# Patient Record
Sex: Female | Born: 1980 | Race: White | Hispanic: No | Marital: Married | Smoking: Former smoker
Health system: Southern US, Community
[De-identification: ages and names within clinical notes are randomized; demographics above are authoritative.]

## PROBLEM LIST (undated history)

## (undated) ENCOUNTER — Emergency Department (HOSPITAL_COMMUNITY): Payer: 59

## (undated) DIAGNOSIS — K5190 Ulcerative colitis, unspecified, without complications: Secondary | ICD-10-CM

## (undated) DIAGNOSIS — K8050 Calculus of bile duct without cholangitis or cholecystitis without obstruction: Secondary | ICD-10-CM

## (undated) HISTORY — DX: Ulcerative colitis, unspecified, without complications: K51.90

## (undated) HISTORY — PX: DILATION AND CURETTAGE OF UTERUS: SHX78

---

## 2008-08-15 HISTORY — PX: COLONOSCOPY: SHX174

## 2013-08-15 HISTORY — PX: ESOPHAGOGASTRODUODENOSCOPY: SHX1529

## 2021-01-15 ENCOUNTER — Other Ambulatory Visit (HOSPITAL_COMMUNITY)
Admission: RE | Admit: 2021-01-15 | Discharge: 2021-01-15 | Disposition: A | Discharge status: Home / Self Care | Payer: 59 | Source: Ambulatory Visit | Attending: Obstetrics & Gynecology | Admitting: Obstetrics & Gynecology

## 2021-01-15 ENCOUNTER — Encounter: Payer: Self-pay | Admitting: Obstetrics & Gynecology

## 2021-01-15 ENCOUNTER — Ambulatory Visit (INDEPENDENT_AMBULATORY_CARE_PROVIDER_SITE_OTHER): Payer: 59 | Admitting: Obstetrics & Gynecology

## 2021-01-15 ENCOUNTER — Other Ambulatory Visit: Payer: Self-pay

## 2021-01-15 VITALS — BP 137/77 | HR 76 | Ht 67.0 in | Wt 183.0 lb

## 2021-01-15 DIAGNOSIS — Z01419 Encounter for gynecological examination (general) (routine) without abnormal findings: Secondary | ICD-10-CM | POA: Diagnosis not present

## 2021-01-15 DIAGNOSIS — Z1231 Encounter for screening mammogram for malignant neoplasm of breast: Secondary | ICD-10-CM

## 2021-01-15 DIAGNOSIS — Z124 Encounter for screening for malignant neoplasm of cervix: Secondary | ICD-10-CM | POA: Diagnosis not present

## 2021-01-15 NOTE — Patient Instructions (Signed)
Please schedule a mammogram at one of the following locations:  [place]: [phone]  [facility] in [place]:[phone] [address]  

## 2021-01-15 NOTE — Progress Notes (Signed)
   WELL-WOMAN EXAMINATION Patient name: Alicia Moon MRN [id number]  Date of birth: 02-12-1981 Chief Complaint:   Annual Exam  History of Present Illness:   Alicia Moon is a 40 y.o. G24P0010 female being seen today for a routine well-woman exam.  Today she notes: no acute complaints or concerns   Patient's last menstrual period was 12/26/2020. Denies issues with her menses The current method of family planning is OCP (estrogen/progesterone).   Originally from [place], lived in [place] for many years, recently moved back ~ 101mos.  Really loves it here and the slower pace.  No longer working/retired  Last pap outside facility.  Last mammogram: last year (outside facility). Last colonoscopy: n/a  Depression screen [facility] 2/9 01/15/2021  Decreased Interest 0  Down, Depressed, Hopeless 0  PHQ - 2 Score 0  Altered sleeping 0  Tired, decreased energy 0  Change in appetite 0  Feeling bad or failure about yourself  0  Trouble concentrating 0  Moving slowly or fidgety/restless 0  Suicidal thoughts 0  PHQ-9 Score 0     Review of Systems:   Pertinent items are noted in HPI Denies any headaches, blurred vision, fatigue, shortness of breath, chest pain, abdominal pain, bowel movements, urination, or intercourse unless otherwise stated above.  Pertinent History Reviewed:  Reviewed past medical,surgical, social and family history.  Reviewed problem list, medications and allergies. Physical Assessment:   Vitals:   01/15/21 1102  BP: 137/77  Pulse: 76  Weight: 183 lb (83 kg)  Height: 5\' 7"  (1.702 m)  Body mass index is 28.66 kg/m.        Physical Examination:   General appearance - well appearing, and in no distress  Mental status - alert, oriented to person, place, and time  Psych:  She has a normal mood and affect  Skin - warm and dry, normal color, no suspicious lesions noted  Chest - effort normal, all lung fields clear to auscultation bilaterally  Heart - normal rate  and regular rhythm  Neck:  midline trachea, no thyromegaly or nodules  Breasts - breasts appear normal, no suspicious masses, no skin or nipple changes or  axillary nodes  Abdomen - soft, nontender, nondistended, no masses or organomegaly  Pelvic - VULVA: normal appearing vulva with no masses, tenderness or lesions  VAGINA: normal appearing vagina with normal color and discharge, no lesions  CERVIX: normal appearing cervix without discharge or lesions, no CMT  Thin prep pap is done with HR HPV cotesting  UTERUS: uterus is felt to be normal size, shape, consistency and nontender   ADNEXA: No adnexal masses or tenderness noted.  Extremities:  No swelling or varicosities noted  Chaperone: & Plan:  1) Well-Woman Exam -pap collected -mammogram ordered  2) Contraceptive management -on OCP and wishes to continue  Orders Placed This Encounter  Procedures  . MM 3D SCREEN BREAST BILATERAL    Meds: No orders of the defined types were placed in this encounter.   Follow-up: Return in about 1 year (around 01/15/2022) for annual (please print AVS).   03/17/2022, DO Attending Obstetrician & Gynecologist, [facility] for [facility]

## 2021-01-18 ENCOUNTER — Other Ambulatory Visit: Payer: Self-pay | Admitting: Adult Health

## 2021-01-19 LAB — CYTOLOGY - PAP
Adequacy: ABSENT
Comment: NEGATIVE
Diagnosis: NEGATIVE
High risk HPV: NEGATIVE

## 2021-01-28 ENCOUNTER — Other Ambulatory Visit: Payer: Self-pay

## 2021-01-28 ENCOUNTER — Encounter: Payer: Self-pay | Admitting: Adult Health

## 2021-01-28 ENCOUNTER — Ambulatory Visit: Payer: 59 | Admitting: Adult Health

## 2021-01-28 VITALS — BP 132/86 | HR 84 | Ht 66.5 in | Wt 183.0 lb

## 2021-01-28 DIAGNOSIS — N9089 Other specified noninflammatory disorders of vulva and perineum: Secondary | ICD-10-CM | POA: Diagnosis not present

## 2021-01-28 DIAGNOSIS — Z3041 Encounter for surveillance of contraceptive pills: Secondary | ICD-10-CM | POA: Diagnosis not present

## 2021-01-28 MED ORDER — NYSTATIN-TRIAMCINOLONE 100000-0.1 UNIT/GM-% EX CREA: 1.0000 "application " | TOPICAL_CREAM | Freq: Two times a day (BID) | CUTANEOUS | 0 refills | Status: DC

## 2021-01-28 MED ORDER — SRONYX 0.1-20 MG-MCG PO TABS: 1.0000 | ORAL_TABLET | Freq: Every day | ORAL | 4 refills | Status: DC

## 2021-01-28 NOTE — Progress Notes (Addendum)
  Subjective:     Patient ID: Alicia Moon, female   DOB: 07-01-1981, 40 y.o.   MRN: [id number]  HPI Alicia Moon is a 40 year old white female, married, G1P0010, in complaining of bump left labia is much better after using warm compresses, feels itchy at times. She requests refill on OCs.  Lab Results  Component Value Date   DIAGPAP  01/15/2021    - Negative for intraepithelial lesion or malignancy (NILM)   HPVHIGH Negative 01/15/2021     Review of Systems Has had bump left labia that was painful is much better but feels itchy Reviewed past medical,surgical, social and family history. Reviewed medications and allergies.     Objective:   Physical Exam BP 132/86 (BP Location: Left Arm, Patient Position: Sitting, Cuff Size: Normal)   Pulse 84   Ht 5' 6.5" (1.689 m)   Wt 183 lb (83 kg)   LMP 01/24/2021   BMI 29.09 kg/m     Skin warm and dry.Pelvic: external genitalia is normal in appearance no lesions, bumps or redness. Fall risk is low  Upstream - 01/28/21 1627       Pregnancy Intention Screening   Does the patient want to become pregnant in the next year? No    Does the patient's partner want to become pregnant in the next year? No    Would the patient like to discuss contraceptive options today? No      Contraception Wrap Up   Current Method Oral Contraceptive    End Method Oral Contraceptive    Contraception Counseling Provided No            Examination chaperoned by Malachy Mood LPN  Assessment:     1. Encounter for surveillance of contraceptive pills Will refill OCs  Meds ordered this encounter  Medications   nystatin-triamcinolone (MYCOLOG II) cream    Sig: Apply 1 application topically 2 (two) times daily.    Dispense:  30 g    Refill:  0    Order Specific Question:   Supervising Provider    Answer:   EURE, LUTHER H [[id number]]   SRONYX 0.1-20 MG-MCG tablet    Sig: Take 1 tablet by mouth daily.    Dispense:  84 tablet    Refill:  4    Order Specific  Question:   Supervising Provider    Answer:   Despina Hidden, LUTHER H [[id number]]     2. Vulvar irritation Will rx mytrex to use prn     Plan:     Follow up prn

## 2021-02-10 ENCOUNTER — Other Ambulatory Visit: Payer: Self-pay

## 2021-02-10 ENCOUNTER — Encounter (HOSPITAL_COMMUNITY): Payer: Self-pay

## 2021-02-10 ENCOUNTER — Ambulatory Visit (HOSPITAL_COMMUNITY)
Admission: RE | Admit: 2021-02-10 | Discharge: 2021-02-10 | Disposition: A | Discharge status: Home / Self Care | Payer: 59 | Source: Ambulatory Visit | Attending: Obstetrics & Gynecology | Admitting: Obstetrics & Gynecology

## 2021-02-10 DIAGNOSIS — Z1231 Encounter for screening mammogram for malignant neoplasm of breast: Secondary | ICD-10-CM | POA: Insufficient documentation

## 2021-02-25 ENCOUNTER — Other Ambulatory Visit (HOSPITAL_COMMUNITY): Payer: Self-pay | Admitting: Obstetrics & Gynecology

## 2021-02-25 ENCOUNTER — Inpatient Hospital Stay
Admission: RE | Admit: 2021-02-25 | Discharge: 2021-02-25 | Disposition: A | Discharge status: Home / Self Care | Payer: Self-pay | Source: Ambulatory Visit | Attending: Obstetrics & Gynecology | Admitting: Obstetrics & Gynecology

## 2021-02-25 DIAGNOSIS — Z1231 Encounter for screening mammogram for malignant neoplasm of breast: Secondary | ICD-10-CM

## 2021-03-09 ENCOUNTER — Other Ambulatory Visit (HOSPITAL_COMMUNITY): Payer: Self-pay | Admitting: Family Medicine

## 2021-03-09 DIAGNOSIS — N63 Unspecified lump in unspecified breast: Secondary | ICD-10-CM

## 2021-03-15 ENCOUNTER — Other Ambulatory Visit: Payer: Self-pay

## 2021-03-15 ENCOUNTER — Ambulatory Visit
Admission: RE | Admit: 2021-03-15 | Discharge: 2021-03-15 | Disposition: A | Discharge status: Home / Self Care | Payer: 59 | Source: Ambulatory Visit | Attending: Family Medicine | Admitting: Family Medicine

## 2021-03-15 VITALS — BP 132/82 | HR 75 | Temp 97.9°F | Resp 15

## 2021-03-15 DIAGNOSIS — J069 Acute upper respiratory infection, unspecified: Secondary | ICD-10-CM | POA: Diagnosis not present

## 2021-03-15 MED ORDER — PROMETHAZINE-DM 6.25-15 MG/5ML PO SYRP: 5.0000 mL | ORAL_SOLUTION | Freq: Four times a day (QID) | ORAL | 0 refills | Status: DC | PRN

## 2021-03-15 NOTE — ED Provider Notes (Signed)
[facility]    CSN: [id number] Arrival date & time: 03/15/21  0847      History   Chief Complaint No chief complaint on file.   HPI Alicia Moon is a 40 y.o. female.   HPI Patient presents with URI symptoms including sore throat, otalgia, post nasal drainage, shoulder aches. No fever, severe cough, chest pain, nausea, or vomiting.Unknown of COVID exposure.  She has taken Sudafed, and Claritin, and ibuprofen.   Past Medical History:  Diagnosis Date   Ulcerative colitis [facility])     Patient Active Problem List   Diagnosis Date Noted   Encounter for surveillance of contraceptive pills 01/28/2021   Vulvar irritation 01/28/2021    Past Surgical History:  Procedure Laterality Date   DILATION AND CURETTAGE OF UTERUS      OB History     Gravida  1   Para      Term      Preterm      AB  1   Living  0      SAB      IAB      Ectopic      Multiple      Live Births               Home Medications    Prior to Admission medications   Medication Sig Start Date End Date Taking? Authorizing Provider  promethazine-dextromethorphan (PROMETHAZINE-DM) 6.25-15 MG/5ML syrup Take 5 mLs by mouth 4 (four) times daily as needed for cough. 03/15/21  Yes Bing Neighbors, FNP  Multiple Vitamin (MULTIVITAMIN ADULT) TABS     [provider]  nystatin-triamcinolone (MYCOLOG II) cream Apply 1 application topically 2 (two) times daily. 01/28/21   Adline Potter, NP  SRONYX 0.1-20 MG-MCG tablet Take 1 tablet by mouth daily. 01/28/21   Adline Potter, NP    Family History Family History  Problem Relation Age of Onset   Colon cancer Maternal Grandmother    Colon cancer Other    Breast cancer Paternal Great-grandmother     Social History Social History   Tobacco Use   Smoking status: Former    Types: Cigarettes   Smokeless tobacco: Never  Vaping Use   Vaping Use: Never used  Substance Use Topics   Alcohol use: Yes    Comment: occ    Drug use: Never     Allergies   Sulfa antibiotics   Review of Systems Review of Systems Pertinent negatives listed in HPI'  Physical Exam Triage Vital Signs ED Triage Vitals  Enc Vitals Group     BP 03/15/21 0950 132/82     Pulse Rate 03/15/21 0950 75     Resp 03/15/21 0950 15     Temp 03/15/21 0950 97.9 F (36.6 C)     Temp Source 03/15/21 0950 Tympanic     SpO2 03/15/21 0950 99 %     Weight --      Height --      Head Circumference --      Peak Flow --      Pain Score 03/15/21 0956 0     Pain Loc --      Pain Edu? --      Excl. in GC? --    No data found.  Updated Vital Signs BP 132/82 (BP Location: Right Arm)   Pulse 75   Temp 97.9 F (36.6 C) (Tympanic)   Resp 15   SpO2 99%   Visual Acuity Right  Eye Distance:   Left Eye Distance:   Bilateral Distance:    Right Eye Near:   Left Eye Near:    Bilateral Near:     Physical Exam  General Appearance:    Alert, cooperative, no distress  HENT:   Normocephalic, ears MEE right ear /left normal, nares mucosal edema with congestion, rhinorrhea, oropharynx erythematous without exudate  Eyes:    PERRL, conjunctiva/corneas clear, EOM's intact       Lungs:     Clear to auscultation bilaterally, respirations unlabored  Heart:    Regular rate and rhythm  Neurologic:   Awake, alert, oriented x 3. No apparent focal neurological deficit.     UC Treatments / Results  Labs (all labs ordered are listed, but only abnormal results are displayed) Labs Reviewed  COVID-19, FLU A+B NAA    EKG   Radiology No results found.  Procedures Procedures (including critical care time)  Medications Ordered in UC Medications - No data to display  Initial Impression / Assessment and Plan / UC Course  I have reviewed the triage vital signs and the nursing notes.  Pertinent labs & imaging results that were available during my care of the patient were reviewed by me and considered in my medical decision making (see chart  for details).     COVID pending. Symptom management warranted only.  Manage fever with Tylenol and ibuprofen.  Nasal symptoms with over-the-counter antihistamines recommended.  Treatment per discharge medications/discharge instructions.  Red flags/ER precautions given. The most current CDC isolation/quarantine recommendation advised.   Final Clinical Impressions(s) / UC Diagnoses   Final diagnoses:  Viral URI     Discharge Instructions      Sudafed every 12 hour as needed. Promethazine every 4-6 hours as needed for cough nasal. Your COVID 19 results should result within 2-4 days. Negative results are immediately resulted to Mychart. Positive results will receive a follow-up call from our clinic. If symptoms are present, I recommend home quarantine until results are known.  Alternate Tylenol and ibuprofen as needed for body aches and fever.  Symptom management per recommendations discussed today.  If any breathing difficulty or chest pain develops go immediately to the closest emergency department for evaluation.       ED Prescriptions     Medication Sig Dispense Auth. Provider   promethazine-dextromethorphan (PROMETHAZINE-DM) 6.25-15 MG/5ML syrup Take 5 mLs by mouth 4 (four) times daily as needed for cough. 140 mL Bing Neighbors, FNP      PDMP not reviewed this encounter.   Bing Neighbors, FNP 03/15/21 1037

## 2021-03-15 NOTE — Discharge Instructions (Addendum)
Sudafed every 12 hour as needed. Promethazine every 4-6 hours as needed for cough nasal. Your COVID 19 results should result within 2-4 days. Negative results are immediately resulted to Mychart. Positive results will receive a follow-up call from our clinic. If symptoms are present, I recommend home quarantine until results are known.  Alternate Tylenol and ibuprofen as needed for body aches and fever.  Symptom management per recommendations discussed today.  If any breathing difficulty or chest pain develops go immediately to the closest emergency department for evaluation.

## 2021-03-15 NOTE — ED Triage Notes (Addendum)
Post nasal drip, scratchy throat,  cough. At home covid is neg.

## 2021-03-16 LAB — COVID-19, FLU A+B NAA
Influenza A, NAA: NOT DETECTED
Influenza B, NAA: NOT DETECTED
SARS-CoV-2, NAA: DETECTED — AB

## 2021-03-30 ENCOUNTER — Ambulatory Visit (HOSPITAL_COMMUNITY)
Admission: RE | Admit: 2021-03-30 | Discharge: 2021-03-30 | Disposition: A | Discharge status: Home / Self Care | Payer: 59 | Source: Ambulatory Visit | Attending: Family Medicine | Admitting: Family Medicine

## 2021-03-30 ENCOUNTER — Other Ambulatory Visit: Payer: Self-pay

## 2021-03-30 DIAGNOSIS — N63 Unspecified lump in unspecified breast: Secondary | ICD-10-CM | POA: Insufficient documentation

## 2021-08-12 DIAGNOSIS — E785 Hyperlipidemia, unspecified: Secondary | ICD-10-CM | POA: Diagnosis not present

## 2021-10-05 DIAGNOSIS — Z1283 Encounter for screening for malignant neoplasm of skin: Secondary | ICD-10-CM | POA: Diagnosis not present

## 2021-10-05 DIAGNOSIS — D225 Melanocytic nevi of trunk: Secondary | ICD-10-CM | POA: Diagnosis not present

## 2022-01-03 ENCOUNTER — Other Ambulatory Visit (HOSPITAL_COMMUNITY): Payer: Self-pay | Admitting: Obstetrics & Gynecology

## 2022-01-03 DIAGNOSIS — Z1231 Encounter for screening mammogram for malignant neoplasm of breast: Secondary | ICD-10-CM

## 2022-01-20 ENCOUNTER — Encounter: Payer: Self-pay | Admitting: Obstetrics & Gynecology

## 2022-01-20 ENCOUNTER — Ambulatory Visit (INDEPENDENT_AMBULATORY_CARE_PROVIDER_SITE_OTHER): Payer: BC Managed Care – PPO | Admitting: Obstetrics & Gynecology

## 2022-01-20 VITALS — BP 129/82 | HR 71 | Ht 67.0 in | Wt 184.0 lb

## 2022-01-20 DIAGNOSIS — Z01419 Encounter for gynecological examination (general) (routine) without abnormal findings: Secondary | ICD-10-CM | POA: Diagnosis not present

## 2022-01-20 DIAGNOSIS — Z3041 Encounter for surveillance of contraceptive pills: Secondary | ICD-10-CM

## 2022-01-20 MED ORDER — SRONYX 0.1-20 MG-MCG PO TABS: 1.0000 | ORAL_TABLET | Freq: Every day | ORAL | 4 refills | Status: DC

## 2022-01-20 NOTE — Progress Notes (Signed)
   WELL-WOMAN EXAMINATION Patient name: Alicia Moon MRN [id number]  Date of birth: 01-01-1981 Chief Complaint:   Gynecologic Exam  History of Present Illness:   Alicia Moon is a 41 y.o. G40P0010 female being seen today for a routine well-woman exam.  Today she notes: no acute complaints or concerns  Doing well with OCPs.  Menses moderate to light, denies intermenstrual bleeding.  Denies issues with her menses  Patient's last menstrual period was 12/27/2021 (exact date).  The current method of family planning is OCP (estrogen/progesterone).    Last pap 01/2021 neg.  Last mammogram: 01/2021- scheduled for August 2023. Last colonoscopy: n/a     01/20/2022    8:53 AM 01/15/2021   10:50 AM  Depression screen PHQ 2/9  Decreased Interest 0 0  Down, Depressed, Hopeless 0 0  PHQ - 2 Score 0 0  Altered sleeping 0 0  Tired, decreased energy 0 0  Change in appetite 0 0  Feeling bad or failure about yourself  0 0  Trouble concentrating 0 0  Moving slowly or fidgety/restless 0 0  Suicidal thoughts 0 0  PHQ-9 Score 0 0      Review of Systems:   Pertinent items are noted in HPI Denies any headaches, blurred vision, fatigue, shortness of breath, chest pain, abdominal pain, bowel movements, urination, or intercourse unless otherwise stated above.  Pertinent History Reviewed:  Reviewed past medical,surgical, social and family history.  Reviewed problem list, medications and allergies. Physical Assessment:   Vitals:   01/20/22 0848  BP: 129/82  Pulse: 71  Weight: 184 lb (83.5 kg)  Height: 5\' 7"  (1.702 m)  Body mass index is 28.82 kg/m.        Physical Examination:   General appearance - well appearing, and in no distress  Mental status - alert, oriented to person, place, and time  Psych:  She has a normal mood and affect  Skin - warm and dry, normal color, no suspicious lesions noted  Chest - effort normal, all lung fields clear to auscultation bilaterally  Heart -  normal rate and regular rhythm  Neck:  midline trachea, no thyromegaly or nodules  Breasts - left upper inner quadrant ~ 11 o'clock 1cm non-tender mass- prior imaging confirmed lipoma breasts appear normal, no suspicious masses, no skin or nipple changes or  axillary nodes  Abdomen - soft, nontender, nondistended, no masses or organomegaly  Pelvic - VULVA: normal appearing vulva with no masses, tenderness or lesions  VAGINA: normal appearing vagina with normal color and discharge, no lesions  CERVIX: normal appearing cervix without discharge or lesions, no CMT  UTERUS: uterus is felt to be normal size, shape, consistency and nontender   ADNEXA: No adnexal masses or tenderness noted.  Extremities:  No swelling or varicosities noted  Chaperone:     Assessment & Plan:  1) Well-Woman Exam Pap up to date reviewed screening Mammogram scheduled  2) Contraception -doing well with OCPs, plan to continue  Meds:  Meds ordered this encounter  Medications   SRONYX 0.1-20 MG-MCG tablet    Sig: Take 1 tablet by mouth daily.    Dispense:  90 tablet    Refill:  4    Follow-up: Return in about 1 year (around 01/21/2023) for Annual.   03/23/2023, DO Attending Obstetrician & Gynecologist, [facility], [facility]

## 2022-01-26 DIAGNOSIS — Z0000 Encounter for general adult medical examination without abnormal findings: Secondary | ICD-10-CM | POA: Diagnosis not present

## 2022-01-26 DIAGNOSIS — E785 Hyperlipidemia, unspecified: Secondary | ICD-10-CM | POA: Diagnosis not present

## 2022-02-02 ENCOUNTER — Ambulatory Visit (HOSPITAL_COMMUNITY)
Admission: RE | Admit: 2022-02-02 | Discharge: 2022-02-02 | Disposition: A | Discharge status: Home / Self Care | Payer: BC Managed Care – PPO | Source: Ambulatory Visit | Attending: Nurse Practitioner | Admitting: Nurse Practitioner

## 2022-02-02 ENCOUNTER — Other Ambulatory Visit (HOSPITAL_COMMUNITY): Payer: Self-pay | Admitting: Nurse Practitioner

## 2022-02-02 DIAGNOSIS — R079 Chest pain, unspecified: Secondary | ICD-10-CM | POA: Insufficient documentation

## 2022-02-02 DIAGNOSIS — Z0001 Encounter for general adult medical examination with abnormal findings: Secondary | ICD-10-CM | POA: Diagnosis not present

## 2022-02-09 ENCOUNTER — Other Ambulatory Visit: Payer: Self-pay | Admitting: Nurse Practitioner

## 2022-02-09 ENCOUNTER — Other Ambulatory Visit (HOSPITAL_COMMUNITY): Payer: Self-pay | Admitting: Nurse Practitioner

## 2022-02-09 DIAGNOSIS — R079 Chest pain, unspecified: Secondary | ICD-10-CM

## 2022-02-19 IMAGING — US US BREAST*L* LIMITED INC AXILLA
1 series · 10 of 10 positions shown · non-contrast
Comparison: Previous exams.

CLINICAL DATA: 40-year-old female with a palpable area of concern
in the left breast.

EXAM:
DIGITAL DIAGNOSTIC UNILATERAL LEFT MAMMOGRAM WITH TOMOSYNTHESIS AND
CAD; ULTRASOUND LEFT BREAST LIMITED
TECHNIQUE: Left digital diagnostic mammography and breast tomosynthesis was
performed. The images were evaluated with computer-aided detection.;
Targeted ultrasound examination of the left breast was performed.

[Series 1: us breast*left* limited inc axilla · 0.07mm/px · 10 of 10 slices shown]
[im 1/10]
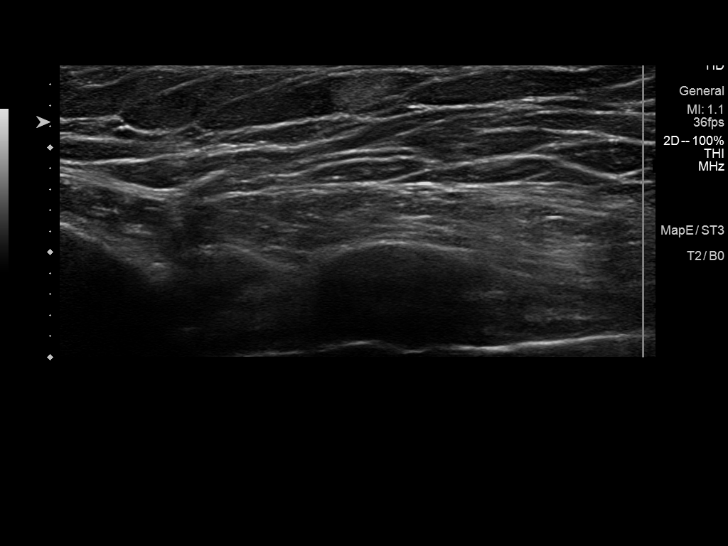
[im 2/10]
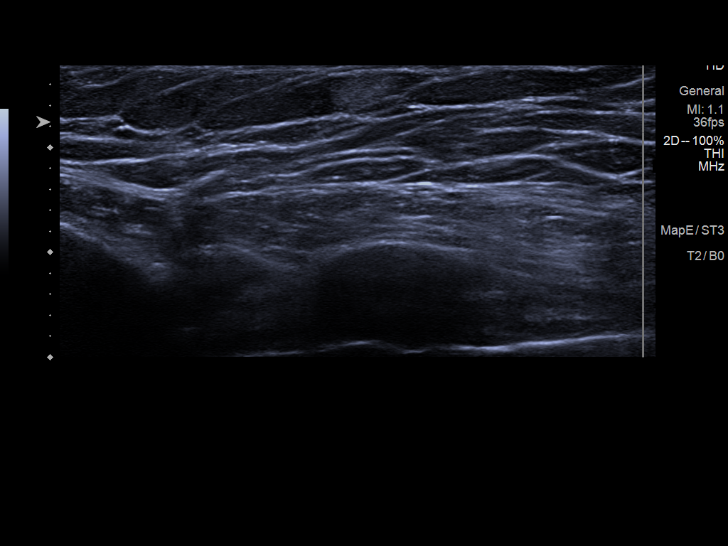
[im 3/10]
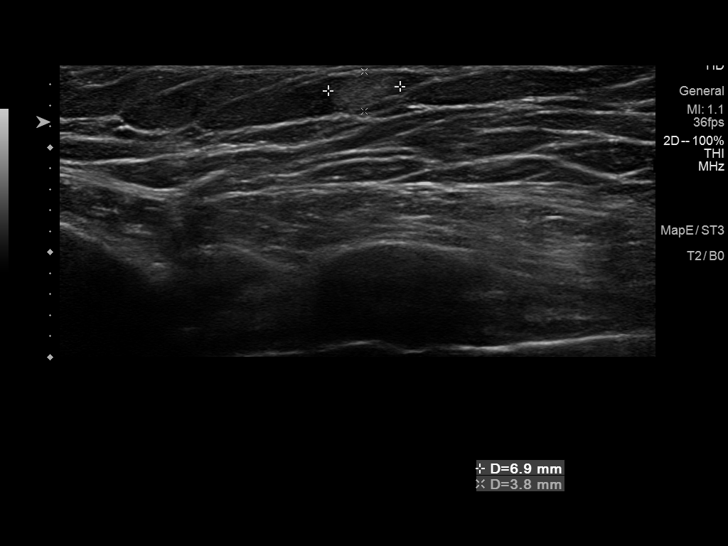
[im 4/10]
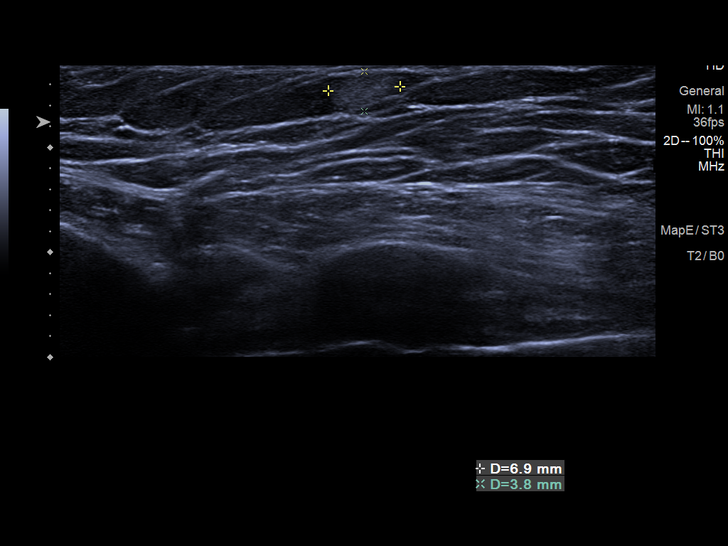
[im 5/10]
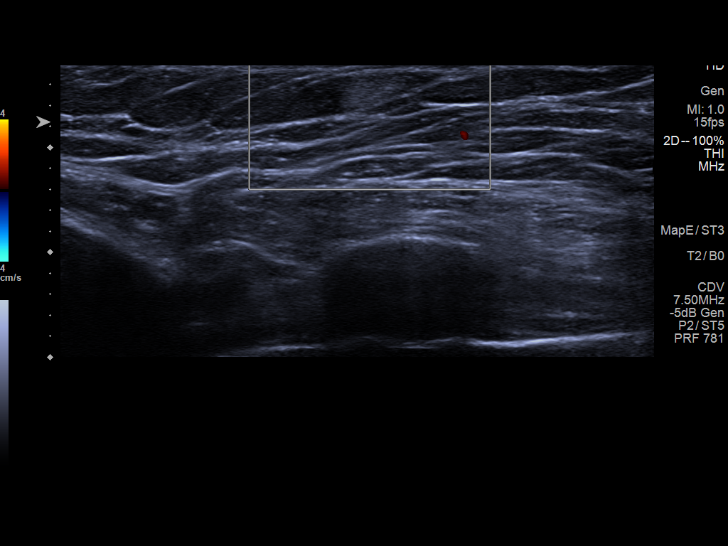
[im 6/10]
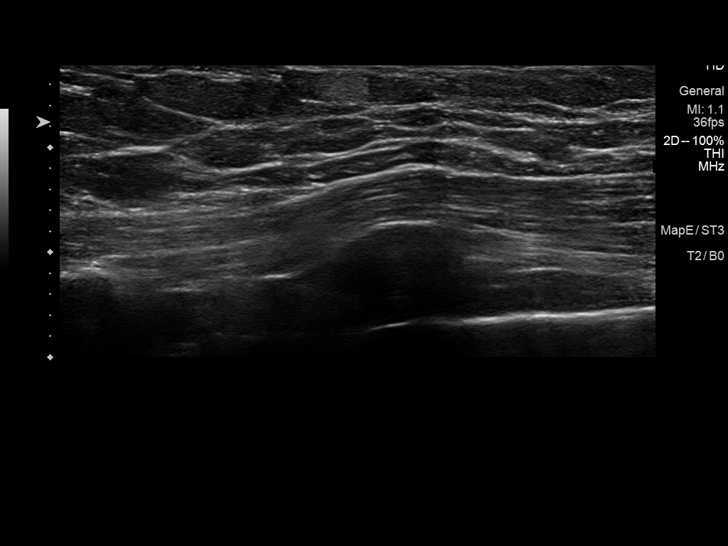
[im 7/10]
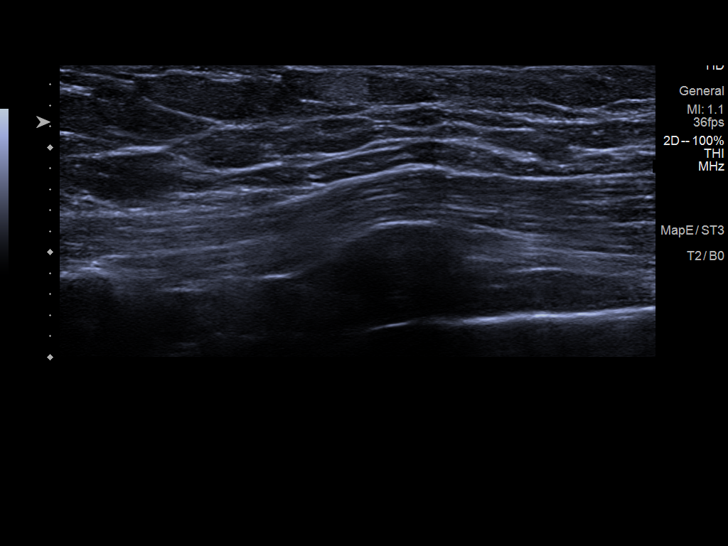
[im 8/10]
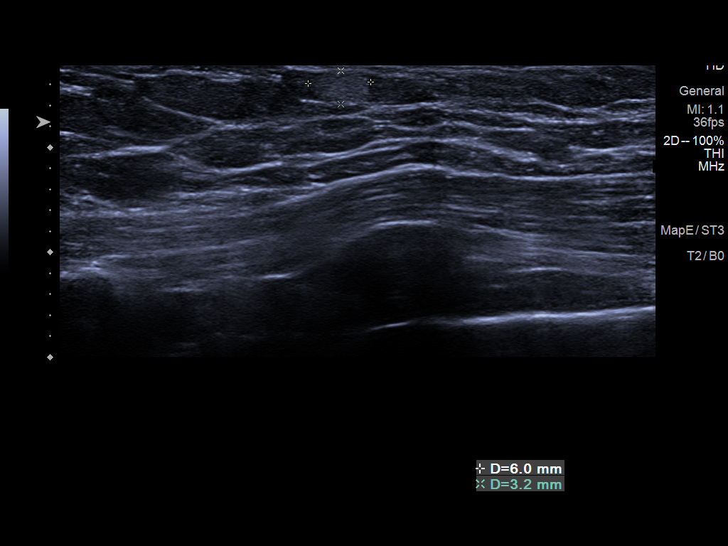
[im 9/10]
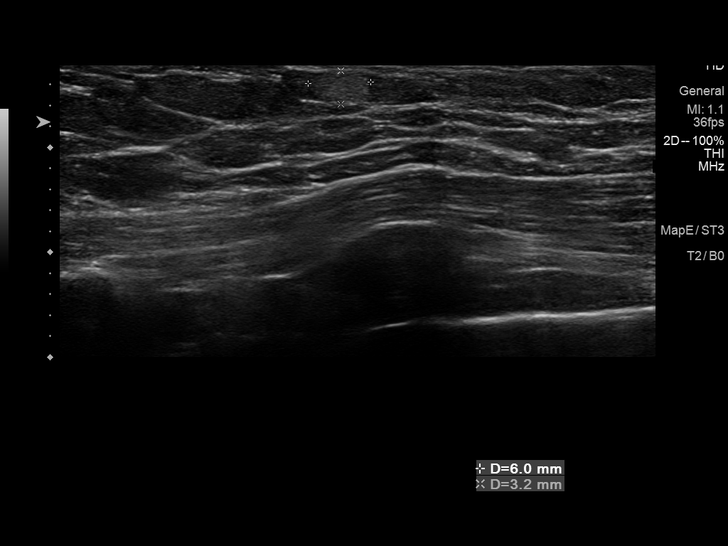
[im 10/10]
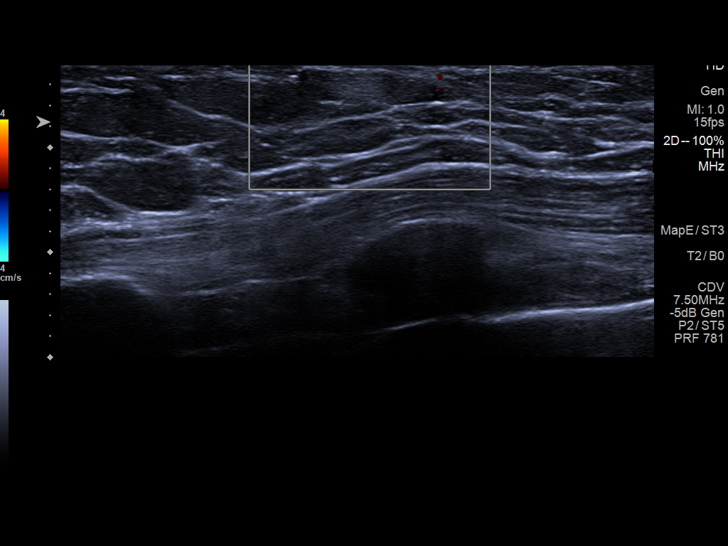

[10 of 10 positions shown; findings below may reference images not displayed]

ACR Breast Density Category b: There are scattered areas of
fibroglandular density.
FINDINGS: No suspicious masses or calcifications are seen in the left breast.
Spot compression tangential tomograms were performed over the
palpable area of concern in the left breast with no definite
abnormality seen.

Physical examination at site of palpable concern in the upper inner
left breast reveals a soft slightly mobile dime-sized mass.

Targeted ultrasound of the left breast was performed. There is an
oval circumscribed mildly hyperechoic mass in the left breast at 11
o'clock 12 cm from nipple measuring 0.7 x 0.4 x 0.6 cm. Findings are
most consistent with a benign lipoma. No suspicious masses or
abnormality seen in the region of palpable concern in the left
breast.
IMPRESSION: Small lipoma at site of palpable concern in the left breast. No
mammographic evidence of malignancy in the left breast.

RECOMMENDATION:
Recommend annual routine screening mammography, due January 2022.

I have discussed the findings and recommendations with the patient.
If applicable, a reminder letter will be sent to the patient
regarding the next appointment.

BI-RADS CATEGORY  2: Benign.

## 2022-02-19 IMAGING — MG MM DIGITAL DIAGNOSTIC UNILAT*L* W/ TOMO W/ CAD
6 series · 6 of 18 positions shown · non-contrast
Comparison: Previous exams.

CLINICAL DATA: 40-year-old female with a palpable area of concern
in the left breast.

EXAM:
DIGITAL DIAGNOSTIC UNILATERAL LEFT MAMMOGRAM WITH TOMOSYNTHESIS AND
CAD; ULTRASOUND LEFT BREAST LIMITED
TECHNIQUE: Left digital diagnostic mammography and breast tomosynthesis was
performed. The images were evaluated with computer-aided detection.;
Targeted ultrasound examination of the left breast was performed.

[L MLO synth-2D]
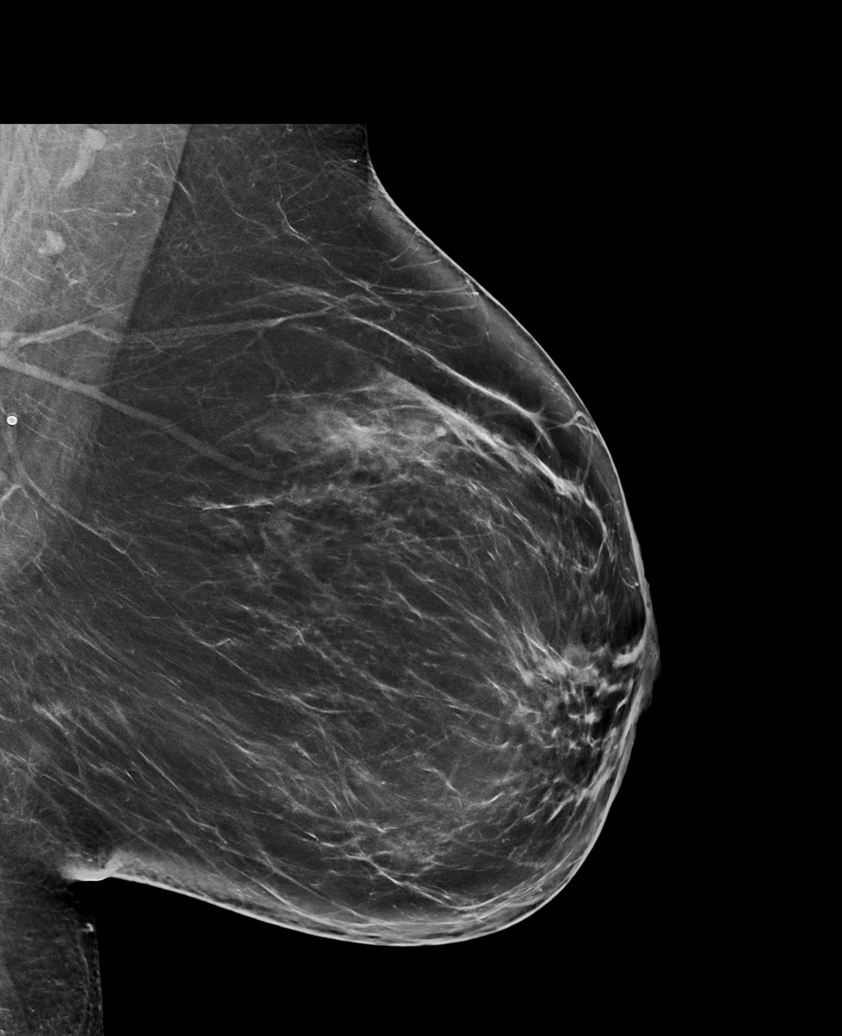

[L TAN synth-2D]
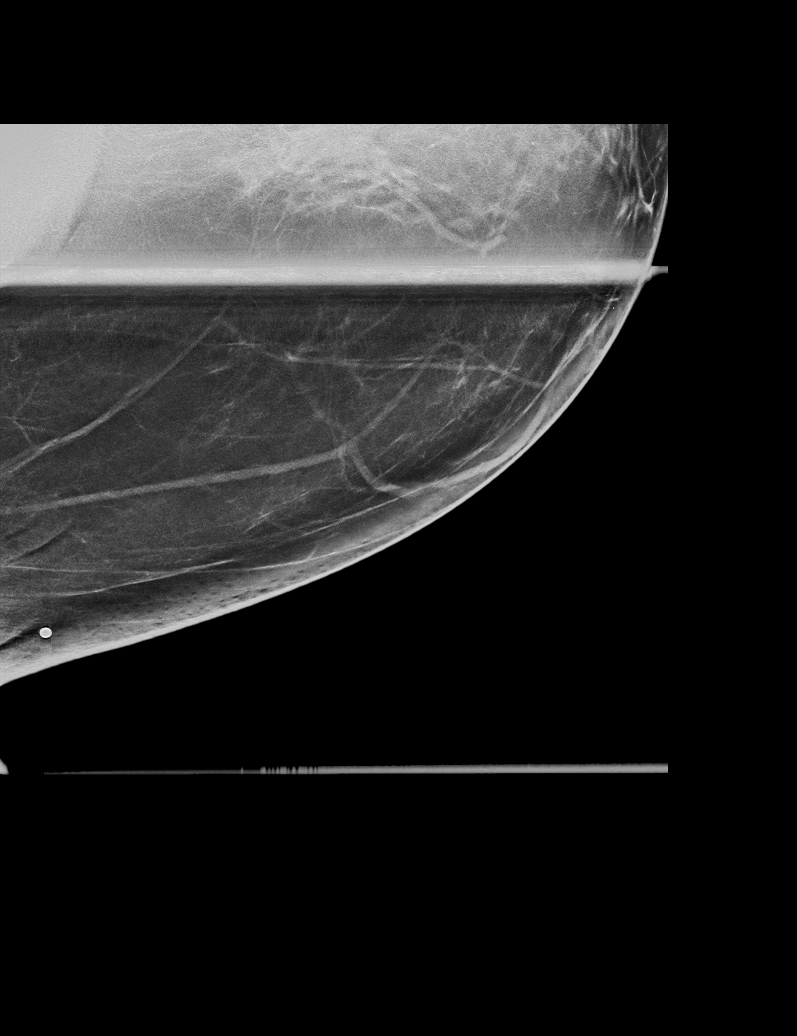

[L CC synth-2D]
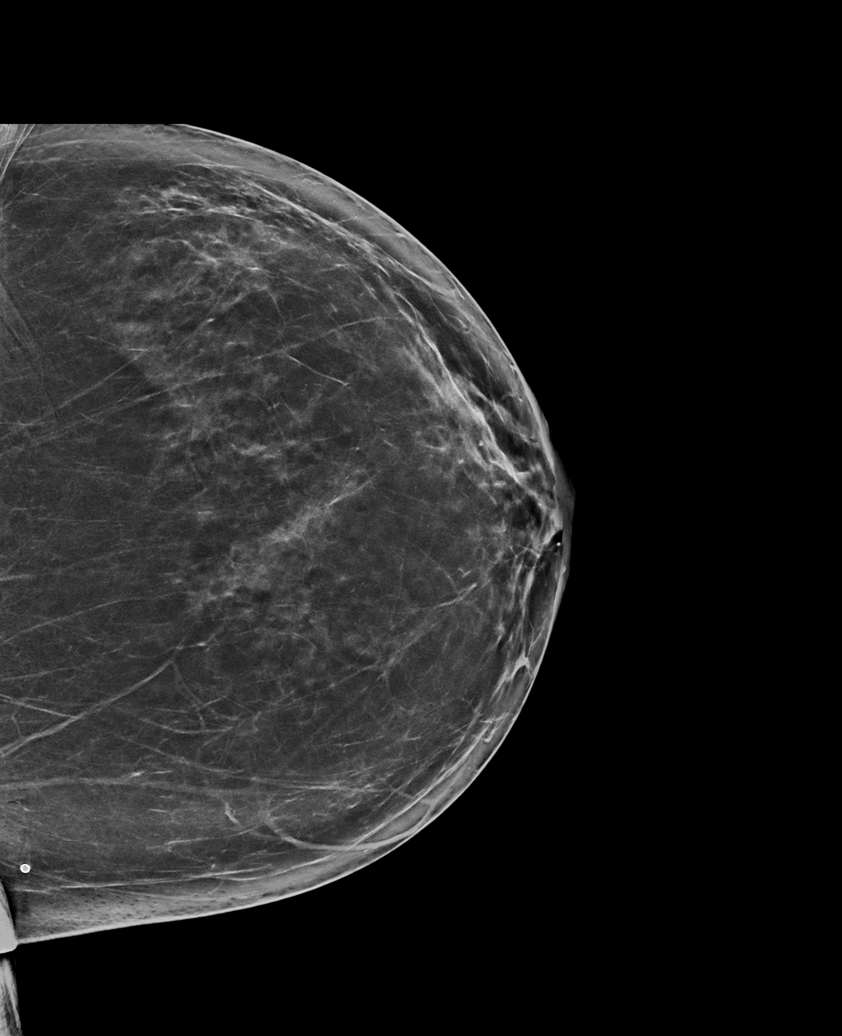

[L MLO tomo · tomo slice 43/84.0]
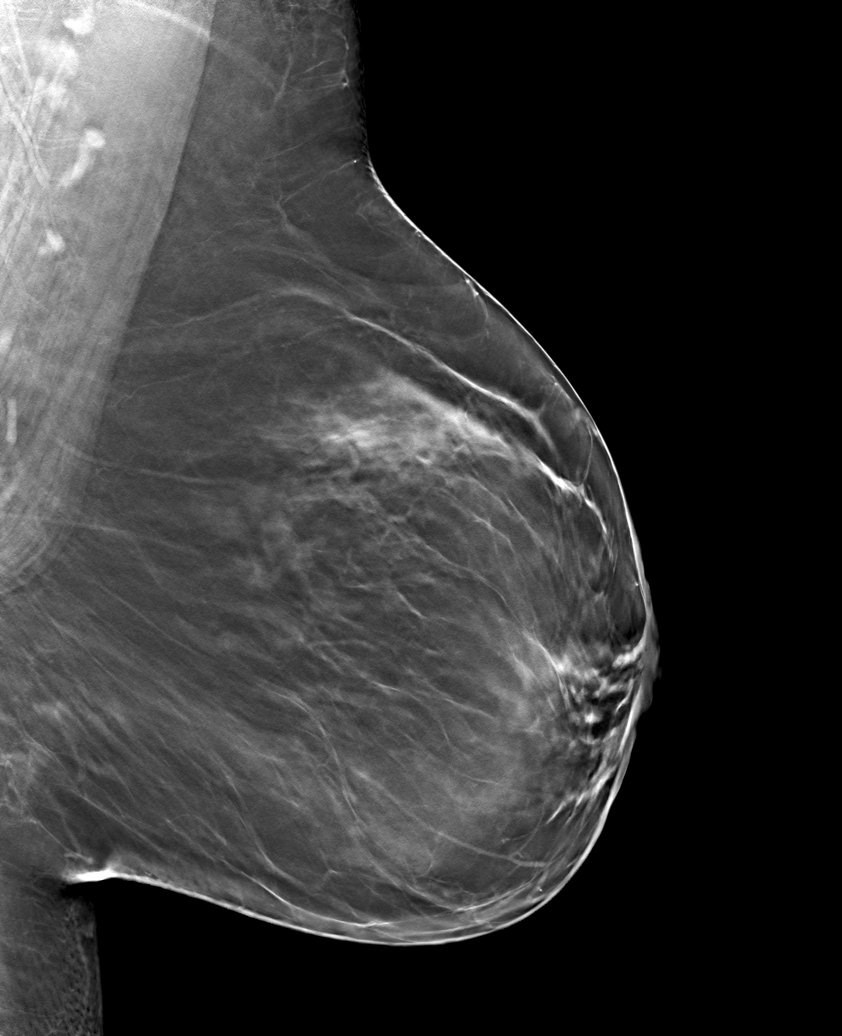

[L CC tomo · tomo slice 39/76.0]
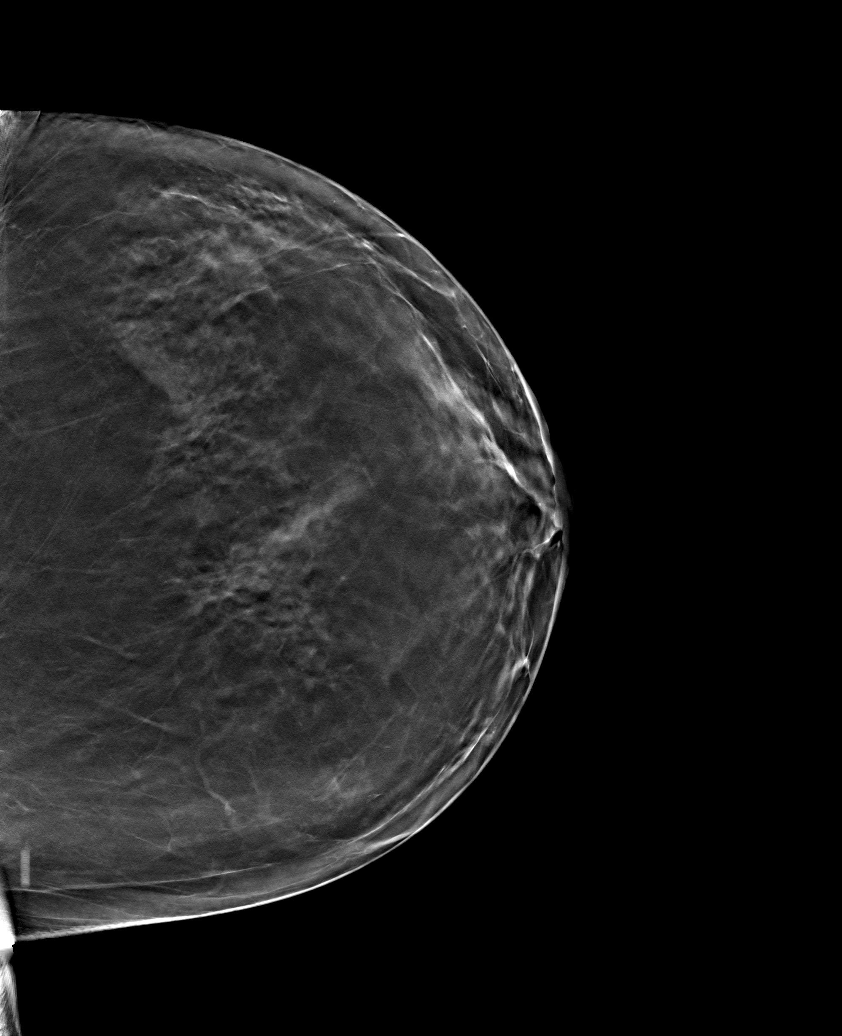

[L TAN tomo · tomo slice 33/64.0]
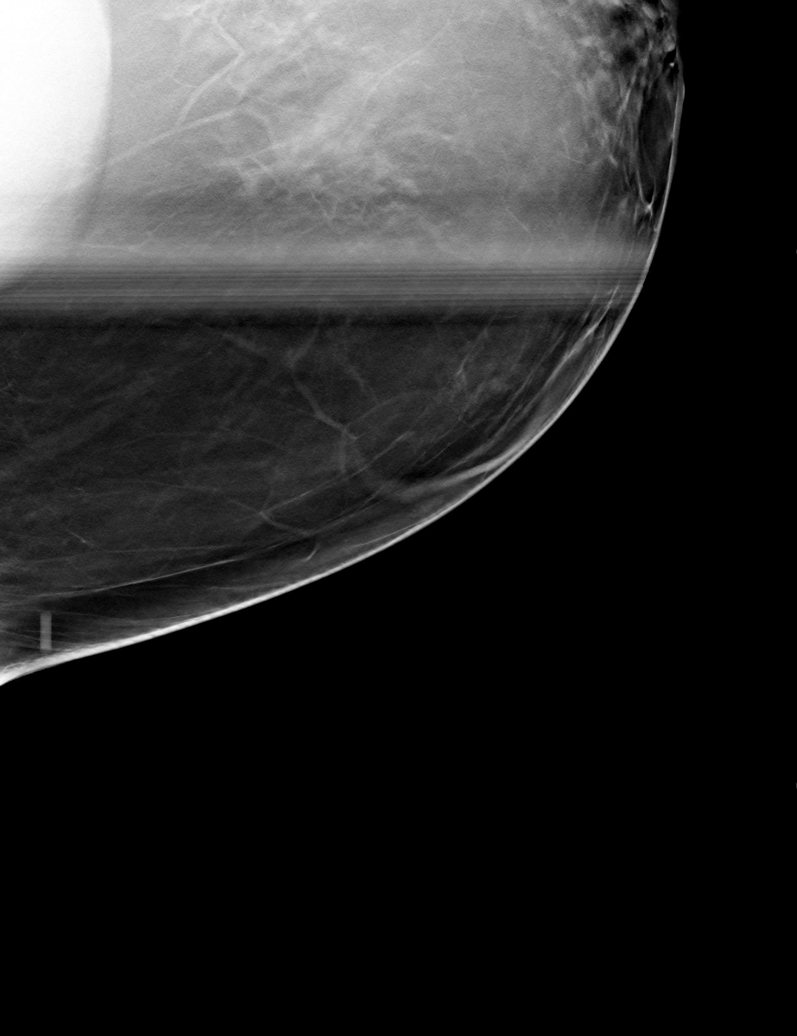

[6 of 18 positions shown; findings below may reference images not displayed]

ACR Breast Density Category b: There are scattered areas of
fibroglandular density.
FINDINGS: No suspicious masses or calcifications are seen in the left breast.
Spot compression tangential tomograms were performed over the
palpable area of concern in the left breast with no definite
abnormality seen.

Physical examination at site of palpable concern in the upper inner
left breast reveals a soft slightly mobile dime-sized mass.

Targeted ultrasound of the left breast was performed. There is an
oval circumscribed mildly hyperechoic mass in the left breast at 11
o'clock 12 cm from nipple measuring 0.7 x 0.4 x 0.6 cm. Findings are
most consistent with a benign lipoma. No suspicious masses or
abnormality seen in the region of palpable concern in the left
breast.
IMPRESSION: Small lipoma at site of palpable concern in the left breast. No
mammographic evidence of malignancy in the left breast.

RECOMMENDATION:
Recommend annual routine screening mammography, due January 2022.

I have discussed the findings and recommendations with the patient.
If applicable, a reminder letter will be sent to the patient
regarding the next appointment.

BI-RADS CATEGORY  2: Benign.

## 2022-03-03 ENCOUNTER — Ambulatory Visit (HOSPITAL_COMMUNITY)
Admission: RE | Admit: 2022-03-03 | Discharge: 2022-03-03 | Disposition: A | Discharge status: Home / Self Care | Payer: BC Managed Care – PPO | Source: Ambulatory Visit | Attending: Nurse Practitioner | Admitting: Nurse Practitioner

## 2022-03-03 DIAGNOSIS — R911 Solitary pulmonary nodule: Secondary | ICD-10-CM | POA: Diagnosis not present

## 2022-03-03 DIAGNOSIS — R079 Chest pain, unspecified: Secondary | ICD-10-CM | POA: Diagnosis not present

## 2022-03-16 ENCOUNTER — Ambulatory Visit (HOSPITAL_COMMUNITY)
Admission: RE | Admit: 2022-03-16 | Discharge: 2022-03-16 | Disposition: A | Discharge status: Home / Self Care | Payer: BC Managed Care – PPO | Source: Ambulatory Visit | Attending: Obstetrics & Gynecology | Admitting: Obstetrics & Gynecology

## 2022-03-16 DIAGNOSIS — Z1231 Encounter for screening mammogram for malignant neoplasm of breast: Secondary | ICD-10-CM | POA: Insufficient documentation

## 2022-03-30 DIAGNOSIS — D1809 Hemangioma of other sites: Secondary | ICD-10-CM | POA: Diagnosis not present

## 2022-04-26 DIAGNOSIS — D225 Melanocytic nevi of trunk: Secondary | ICD-10-CM | POA: Diagnosis not present

## 2022-04-26 DIAGNOSIS — Z1283 Encounter for screening for malignant neoplasm of skin: Secondary | ICD-10-CM | POA: Diagnosis not present

## 2022-07-26 DIAGNOSIS — E7800 Pure hypercholesterolemia, unspecified: Secondary | ICD-10-CM | POA: Diagnosis not present

## 2022-08-16 DIAGNOSIS — R079 Chest pain, unspecified: Secondary | ICD-10-CM | POA: Diagnosis not present

## 2022-08-16 DIAGNOSIS — E7800 Pure hypercholesterolemia, unspecified: Secondary | ICD-10-CM | POA: Diagnosis not present

## 2022-08-16 DIAGNOSIS — K829 Disease of gallbladder, unspecified: Secondary | ICD-10-CM | POA: Diagnosis not present

## 2022-08-16 DIAGNOSIS — K5190 Ulcerative colitis, unspecified, without complications: Secondary | ICD-10-CM | POA: Diagnosis not present

## 2022-08-16 DIAGNOSIS — N6320 Unspecified lump in the left breast, unspecified quadrant: Secondary | ICD-10-CM | POA: Diagnosis not present

## 2022-12-25 IMAGING — DX DG CHEST 2V
2 series · 2 of 2 positions shown · non-contrast
Comparison: None Available.

CLINICAL DATA: Chest pain

EXAM:
CHEST - 2 VIEW

[chest pa]
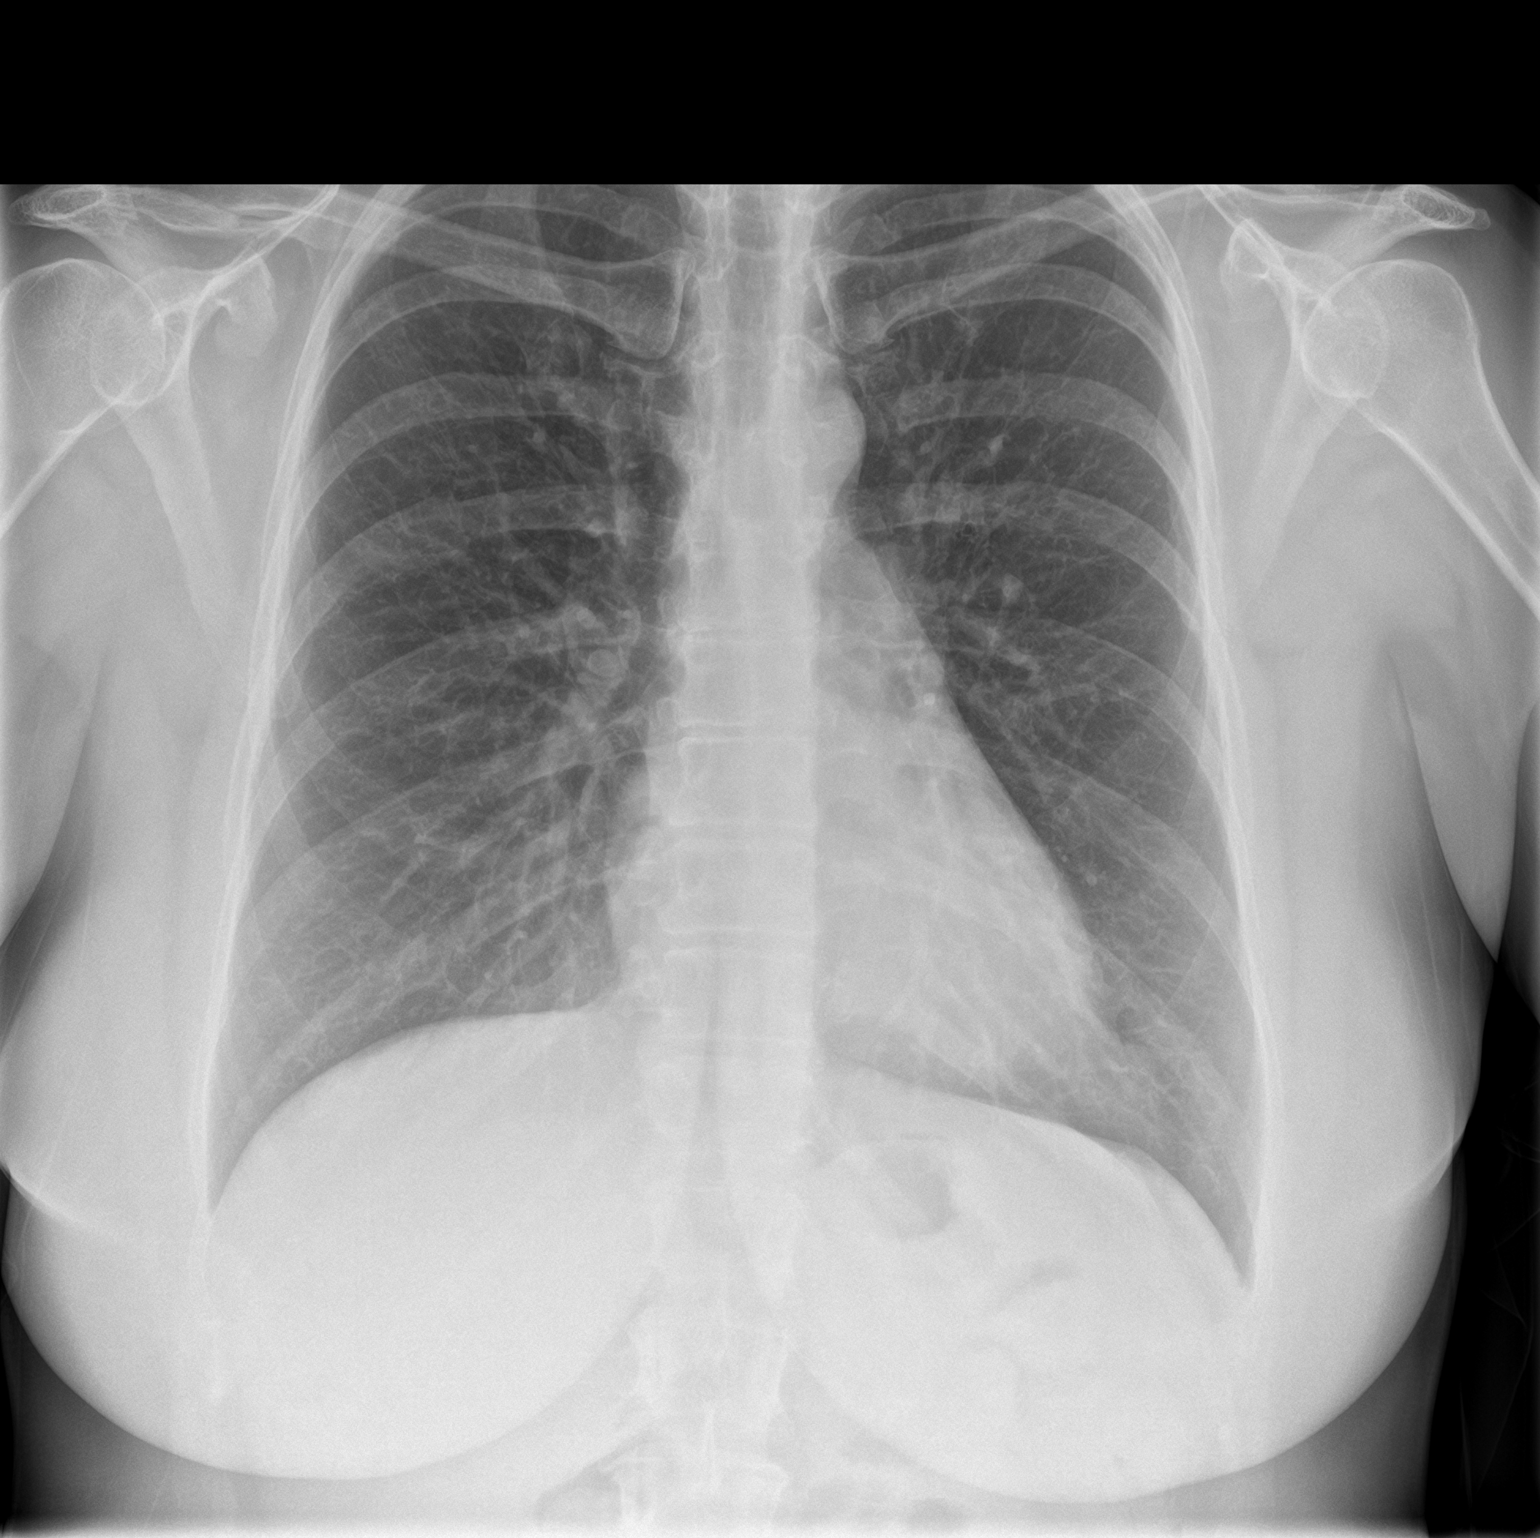

[chest lat]
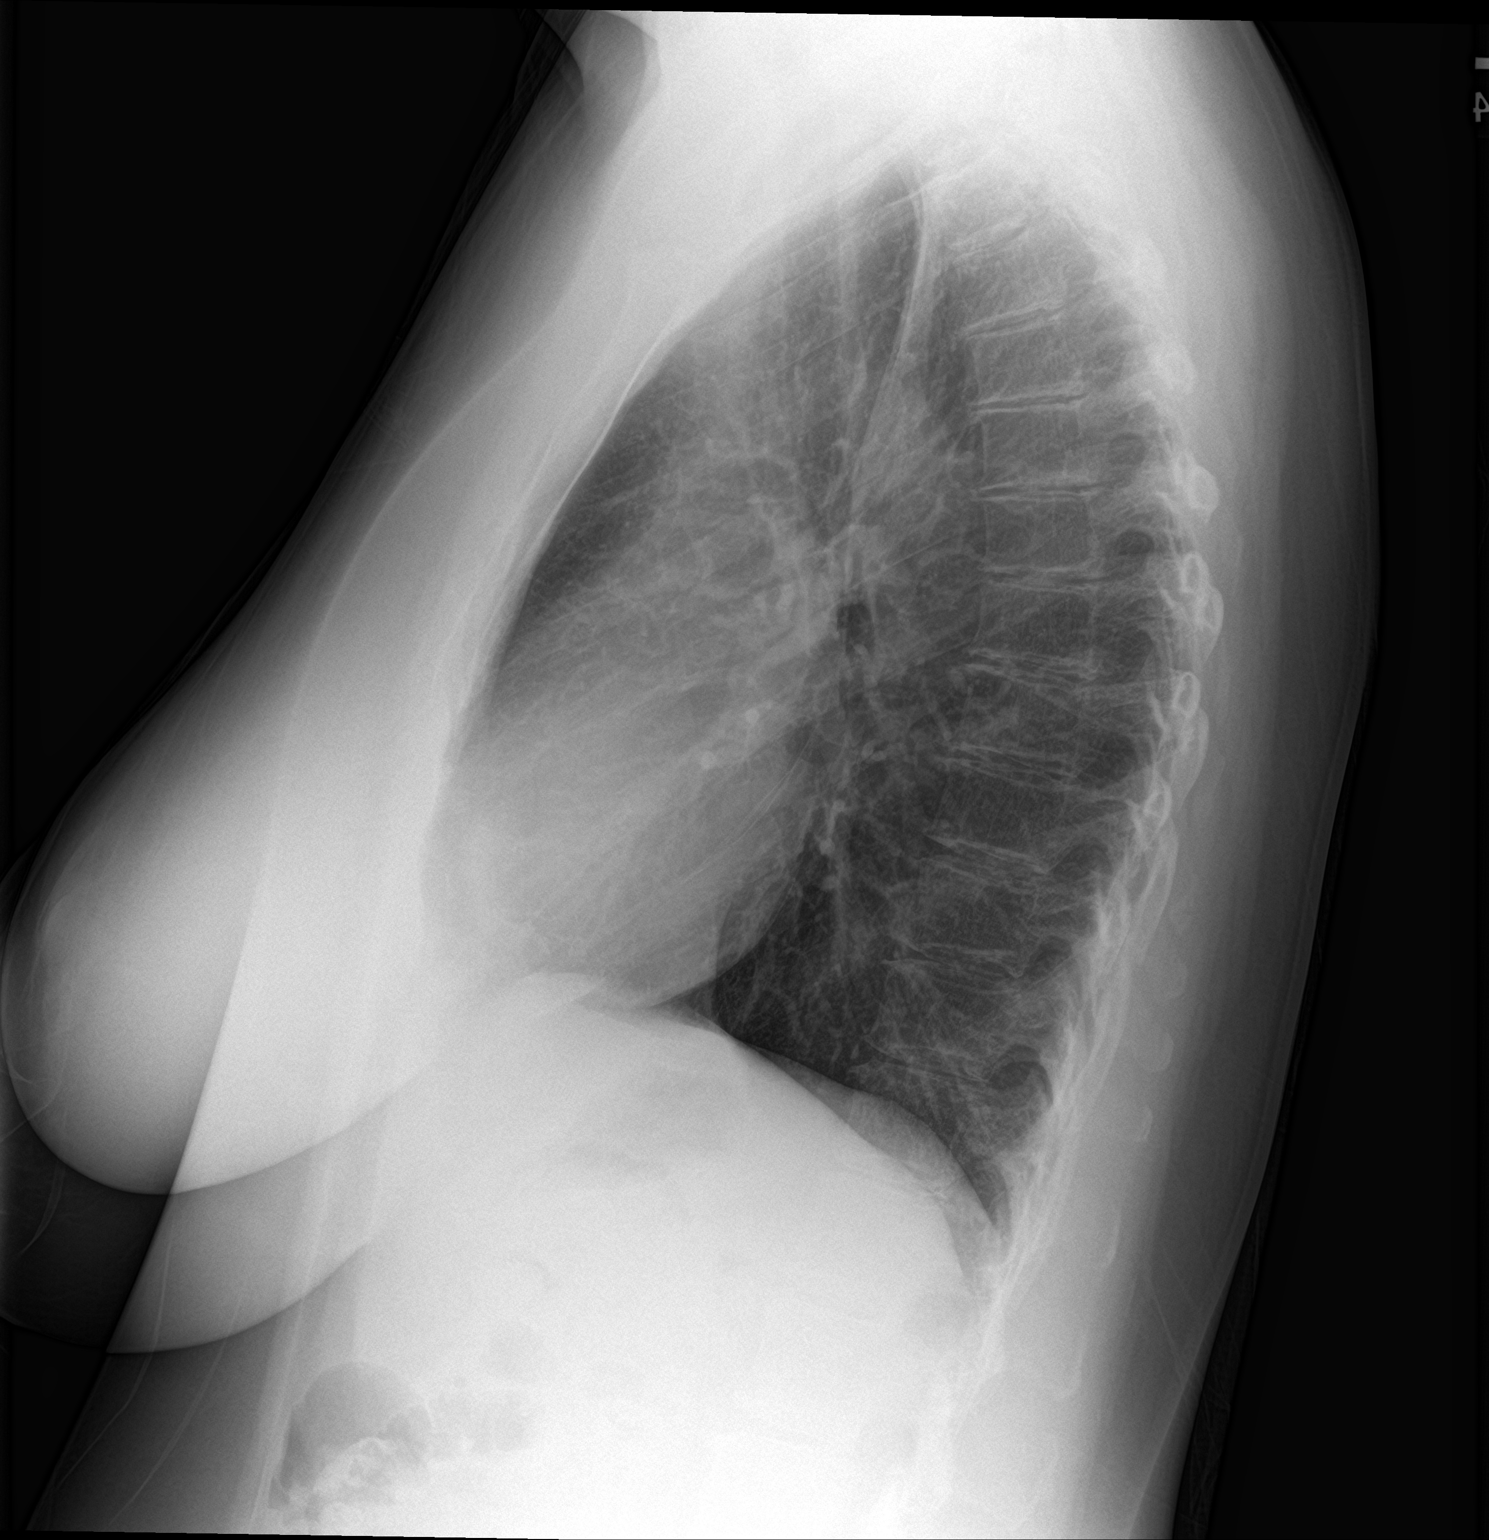

[2 of 2 positions shown; findings below may reference images not displayed]

FINDINGS: The heart size and mediastinal contours are within normal limits.
Both lungs are clear. The visualized skeletal structures are
unremarkable.
IMPRESSION: No active cardiopulmonary disease.

## 2023-01-23 DIAGNOSIS — R109 Unspecified abdominal pain: Secondary | ICD-10-CM | POA: Diagnosis not present

## 2023-01-23 DIAGNOSIS — K829 Disease of gallbladder, unspecified: Secondary | ICD-10-CM | POA: Diagnosis not present

## 2023-01-23 DIAGNOSIS — Z713 Dietary counseling and surveillance: Secondary | ICD-10-CM | POA: Diagnosis not present

## 2023-01-23 DIAGNOSIS — R1033 Periumbilical pain: Secondary | ICD-10-CM | POA: Diagnosis not present

## 2023-01-23 DIAGNOSIS — D170 Benign lipomatous neoplasm of skin and subcutaneous tissue of head, face and neck: Secondary | ICD-10-CM | POA: Diagnosis not present

## 2023-01-25 ENCOUNTER — Ambulatory Visit: Payer: BC Managed Care – PPO | Admitting: Obstetrics & Gynecology

## 2023-01-25 ENCOUNTER — Encounter: Payer: Self-pay | Admitting: Obstetrics & Gynecology

## 2023-01-25 VITALS — BP 135/68 | HR 68 | Ht 67.0 in | Wt 190.2 lb

## 2023-01-25 DIAGNOSIS — Z01419 Encounter for gynecological examination (general) (routine) without abnormal findings: Secondary | ICD-10-CM

## 2023-01-25 DIAGNOSIS — Z3041 Encounter for surveillance of contraceptive pills: Secondary | ICD-10-CM | POA: Diagnosis not present

## 2023-01-25 MED ORDER — SRONYX 0.1-20 MG-MCG PO TABS: 1.0000 | ORAL_TABLET | Freq: Every day | ORAL | 4 refills | Status: DC

## 2023-01-25 NOTE — Progress Notes (Signed)
   WELL-WOMAN EXAMINATION Patient name: Alicia Moon MRN [id number]  Date of birth: 02/15/1981 Chief Complaint:   Annual Exam  History of Present Illness:   Alicia Moon is a 42 y.o. G75P0010 female being seen today for a routine well-woman exam.    On OCPs- menses light or sometimes non-existent.  Denies intermenstrual bleeding.  Denies vaginal discharge, itch, irritation.  Denies pelvic or abdominal pain.  No acute GYN concerns   Patient's last menstrual period was 01/21/2023. Denies issues with her menses The current method of family planning is OCP (estrogen/progesterone).    Last pap 2022.  Last mammogram: 03/2022. Last colonoscopy: NA     01/25/2023    8:28 AM 01/20/2022    8:53 AM 01/15/2021   10:50 AM  Depression screen PHQ 2/9  Decreased Interest 0 0 0  Down, Depressed, Hopeless 0 0 0  PHQ - 2 Score 0 0 0  Altered sleeping 0 0 0  Tired, decreased energy 0 0 0  Change in appetite 1 0 0  Feeling bad or failure about yourself  0 0 0  Trouble concentrating 0 0 0  Moving slowly or fidgety/restless 0 0 0  Suicidal thoughts 0 0 0  PHQ-9 Score 1 0 0      Review of Systems:   Pertinent items are noted in HPI Denies any headaches, blurred vision, fatigue, shortness of breath, chest pain, abdominal pain, bowel movements, urination, or intercourse unless otherwise stated above.  Pertinent History Reviewed:  Reviewed past medical,surgical, social and family history.  Reviewed problem list, medications and allergies. Physical Assessment:   Vitals:   01/25/23 0828  BP: 135/68  Pulse: 68  Weight: 190 lb 3.2 oz (86.3 kg)  Height: 5\' 7"  (1.702 m)  Body mass index is 29.79 kg/m.        Physical Examination:   General appearance - well appearing, and in no distress  Mental status - alert, oriented to person, place, and time  Psych:  She has a normal mood and affect  Skin - warm and dry, normal color, no suspicious lesions noted  Chest - effort normal, all lung  fields clear to auscultation bilaterally  Heart - normal rate and regular rhythm  Neck:  midline trachea, no thyromegaly or nodules  Breasts - breasts appear normal, no suspicious masses, no skin or nipple changes or  axillary nodes  Abdomen - soft, nontender, nondistended, no masses or organomegaly  Pelvic - VULVA: normal appearing vulva with no masses, tenderness or lesions  VAGINA: normal appearing vagina with normal color and discharge, no lesions  CERVIX: normal appearing cervix without discharge or lesions, no CMT  UTERUS: uterus is felt to be normal size, shape, consistency and nontender   ADNEXA: No adnexal masses or tenderness noted.  Extremities:  No swelling or varicosities noted  Chaperone: Latisha Cresenzo     Assessment & Plan:  1) Well-Woman Exam -pap up to date -mammogram up to date, due in August  2) Contraceptive - doing well with current pill and wishes to continue   Meds:  Meds ordered this encounter  Medications   SRONYX 0.1-20 MG-MCG tablet    Sig: Take 1 tablet by mouth daily.    Dispense:  90 tablet    Refill:  4    Follow-up: Return in about 1 year (around 01/25/2024) for Annual.   Myna Hidalgo, DO Attending Obstetrician & Gynecologist, [facility], [facility]

## 2023-02-20 DIAGNOSIS — K8050 Calculus of bile duct without cholangitis or cholecystitis without obstruction: Secondary | ICD-10-CM | POA: Diagnosis not present

## 2023-02-22 ENCOUNTER — Other Ambulatory Visit (HOSPITAL_COMMUNITY): Payer: Self-pay | Admitting: Surgery

## 2023-02-22 DIAGNOSIS — K8050 Calculus of bile duct without cholangitis or cholecystitis without obstruction: Secondary | ICD-10-CM

## 2023-02-22 DIAGNOSIS — R1011 Right upper quadrant pain: Secondary | ICD-10-CM

## 2023-03-01 DIAGNOSIS — E7800 Pure hypercholesterolemia, unspecified: Secondary | ICD-10-CM | POA: Diagnosis not present

## 2023-03-01 DIAGNOSIS — R7301 Impaired fasting glucose: Secondary | ICD-10-CM | POA: Diagnosis not present

## 2023-03-07 ENCOUNTER — Ambulatory Visit (HOSPITAL_COMMUNITY)
Admission: RE | Admit: 2023-03-07 | Discharge: 2023-03-07 | Disposition: A | Discharge status: Home / Self Care | Payer: BC Managed Care – PPO | Source: Ambulatory Visit | Attending: Surgery | Admitting: Surgery

## 2023-03-07 ENCOUNTER — Other Ambulatory Visit: Payer: Self-pay | Admitting: Surgery

## 2023-03-07 DIAGNOSIS — R1011 Right upper quadrant pain: Secondary | ICD-10-CM | POA: Diagnosis not present

## 2023-03-07 DIAGNOSIS — K8050 Calculus of bile duct without cholangitis or cholecystitis without obstruction: Secondary | ICD-10-CM

## 2023-03-07 DIAGNOSIS — K5190 Ulcerative colitis, unspecified, without complications: Secondary | ICD-10-CM | POA: Diagnosis not present

## 2023-03-07 DIAGNOSIS — E663 Overweight: Secondary | ICD-10-CM | POA: Diagnosis not present

## 2023-03-07 DIAGNOSIS — K829 Disease of gallbladder, unspecified: Secondary | ICD-10-CM | POA: Diagnosis not present

## 2023-03-07 DIAGNOSIS — E7800 Pure hypercholesterolemia, unspecified: Secondary | ICD-10-CM | POA: Diagnosis not present

## 2023-03-07 DIAGNOSIS — Z6829 Body mass index (BMI) 29.0-29.9, adult: Secondary | ICD-10-CM | POA: Diagnosis not present

## 2023-03-22 ENCOUNTER — Encounter: Payer: Self-pay | Admitting: Internal Medicine

## 2023-03-22 ENCOUNTER — Other Ambulatory Visit (HOSPITAL_COMMUNITY): Payer: Self-pay | Admitting: Internal Medicine

## 2023-03-22 ENCOUNTER — Other Ambulatory Visit: Payer: BC Managed Care – PPO

## 2023-03-22 ENCOUNTER — Other Ambulatory Visit (HOSPITAL_COMMUNITY): Payer: Self-pay | Admitting: Obstetrics & Gynecology

## 2023-03-22 ENCOUNTER — Ambulatory Visit: Payer: BC Managed Care – PPO | Admitting: Internal Medicine

## 2023-03-22 VITALS — BP 120/74 | HR 70 | Ht 67.0 in | Wt 185.2 lb

## 2023-03-22 DIAGNOSIS — K51919 Ulcerative colitis, unspecified with unspecified complications: Secondary | ICD-10-CM

## 2023-03-22 DIAGNOSIS — K828 Other specified diseases of gallbladder: Secondary | ICD-10-CM

## 2023-03-22 DIAGNOSIS — R1013 Epigastric pain: Secondary | ICD-10-CM

## 2023-03-22 DIAGNOSIS — Z1231 Encounter for screening mammogram for malignant neoplasm of breast: Secondary | ICD-10-CM

## 2023-03-22 MED ORDER — SUTAB 1479-225-188 MG PO TABS: 24.0000 | ORAL_TABLET | Freq: Once | ORAL | 0 refills | Status: AC

## 2023-03-22 NOTE — Telephone Encounter (Signed)
Alicia Moon Please be on the look out for this colonoscopy report Thanks JMP

## 2023-03-22 NOTE — Progress Notes (Signed)
Patient ID: Alicia Moon, female   DOB: [birth year], 42 y.o.   MRN: [id number] HPI: Alicia Moon is a 42 year old female with a history of ulcerative colitis, biliary dyskinesia who is seen in consult at the request of Dr. Margo Aye to evaluate upper abdominal pain, right upper quadrant pain, mid abdominal pain and her history of ulcerative colitis.  She is here alone today.  She reports that about 15 years ago, just before age 46 she developed fairly sudden onset of severe acute diarrhea with abdominal pain.  Eventually this led to an ER visit where a CT scan suggested ulcerative colitis.  She continued to have issues and establish care with Dr. Donavan Foil in [facility].  In 2010 she had a colonoscopy and was told that she has ulcerative colitis.  She does not recall any treatment recommended at that time.  Her symptoms were diarrhea, crampy abdominal pain with mucus in stool.  She has since and about 2 or 3 years ago moved to this area.  She has now intermittent mucus in her stool.  At times she feels like she has "flares" of crampy abdominal pain and mucus without significant diarrhea.  No blood in stool.  She does deal intermittently with right upper quadrant abdominal pain radiating to her back.  She estimates 6-10 attacks per year.  In 2015 she was told that this was likely due to biliary dyskinesia given that a HIDA scan showed a low gallbladder ejection fraction.  Several ultrasounds have not shown any cholelithiasis.  She seen Dr. Magnus Ivan with [facility] who is considering cholecystectomy.  Separate from the right upper quadrant pain and for about 2 months she has felt some epigastric pain under her bra line and more in the center of the epigastrium.  This is not burning but it is more of a "gritty" feeling.  She has changed her diet and avoided reflux trigger foods like coffee tea and chocolate.  It feels a little bit better with dietary change.  She wonders if she has a hiatal hernia.  She  does occasionally use Pepcid.  Family history negative for IBD.  Her maternal grandmother had colon cancer and died of the same at age 19.  Her paternal great uncle also had colon cancer and died of the same in his 7s.  She has had prior EGD and colonoscopy as above with Dr. Donavan Foil in [place].  Past Medical History:  Diagnosis Date   Ulcerative colitis [facility])     Past Surgical History:  Procedure Laterality Date   COLONOSCOPY  2010   [facility] [place] Dr Donavan Foil   DILATION AND CURETTAGE OF UTERUS     ESOPHAGOGASTRODUODENOSCOPY  2015   [facility] Dr Donavan Foil    Outpatient Medications Prior to Visit  Medication Sig Dispense Refill   Multiple Vitamin (MULTIVITAMIN ADULT) TABS 1 tablet daily.     Omega-3 Fatty Acids (FISH OIL) 645 MG CAPS 1 tablet daily.     SRONYX 0.1-20 MG-MCG tablet Take 1 tablet by mouth daily. 90 tablet 4   Zinc 25 MG TABS 1 tablet daily.     No facility-administered medications prior to visit.    Allergies  Allergen Reactions   Sulfa Antibiotics     Family History  Problem Relation Age of Onset   Colon cancer Maternal Grandmother    Breast cancer Paternal Great-grandmother    Colon cancer Other    Esophageal cancer Neg Hx     Social History   Tobacco  Use   Smoking status: Former    Types: Cigarettes   Smokeless tobacco: Never  Vaping Use   Vaping status: Never Used  Substance Use Topics   Alcohol use: Yes    Comment: occ   Drug use: Never    ROS: As per history of present illness, otherwise negative  BP 120/74   Pulse 70   Ht 5\' 7"  (1.702 m)   Wt 185 lb 4 oz (84 kg)   BMI 29.01 kg/m  Gen: awake, alert, NAD HEENT: anicteric  CV: RRR, no mrg Pulm: CTA b/l Abd: soft, mild tenderness in the epigastrium without rebound or guarding, nondistended, +BS throughout Ext: no c/c/e Neuro: nonfocal  RELEVANT LABS AND IMAGING: ULTRASOUND ABDOMEN LIMITED RIGHT UPPER QUADRANT   COMPARISON:  None  Available.   FINDINGS: Gallbladder:   No gallstones or wall thickening visualized. No sonographic Murphy sign noted by sonographer.   Common bile duct:   Diameter: 3.4 mm   Liver:   No focal lesion identified. Within normal limits in parenchymal echogenicity. Portal vein is patent on color Doppler imaging with normal direction of blood flow towards the liver.   Other: None.   IMPRESSION: No cholelithiasis or sonographic evidence for acute cholecystitis.     Electronically Signed   By: Annia Belt M.D.   On: 03/07/2023 07:43   ASSESSMENT/PLAN: 42 year old female with a history of ulcerative colitis, biliary dyskinesia who is seen in consult at the request of Dr. Margo Aye to evaluate upper abdominal pain, right upper quadrant pain, mid abdominal pain and her history of ulcerative colitis.   Ulcerative colitis/intermittent abdominal pain and mucus in stool --I would like to obtain her colonoscopy and pathology records from [place] if possible.  Certainly if she has had ulcerative colitis for 14 years we need to reassess disease activity.  She has some intermittent mucus and abdominal pain but no significant diarrhea.  We discussed ulcerative colitis in general today and how this is generally a chronic condition that needs maintenance therapy.  I recommended that we repeat colonoscopy. -- Check fecal calprotectin -- Colonoscopy in [facility]; we reviewed the risk, benefits and alternatives and she is agreeable and wishes to proceed  2.  Right upper quadrant pain/biliary dyskinesia --reportedly low EF on previous HIDA scan.  Her right upper quadrant pain attacks followed by diarrhea 48 hours later to sound consistent with biliary dyskinesia.  She is likely going to pursue laparoscopic cholecystectomy later this year with Dr. Magnus Ivan -- Eventual cholecystectomy -- EGD first, see #3  3.  Epigastric pain --she has a different epigastric type pain as opposed to the more episodic right upper  quadrant pain which sounds more biliary in nature.  She does use intermittent famotidine.  Given this symptom I recommend we repeat upper endoscopy. -- EGD in the [facility]; we reviewed the risk and benefits as well as alternatives to upper endoscopy and colonoscopy and she is agreeable and wishes to proceed    ZO:XWRU, Kathleene Hazel, Md [address] [place],  [place] [address]

## 2023-03-22 NOTE — Patient Instructions (Addendum)
You have been scheduled for an endoscopy and colonoscopy. Please follow written instructions given to you at your visit today.  If you use inhalers (even only as needed), please bring them with you on the day of your procedure.  If you take any of the following medications, they will need to be adjusted prior to your procedure:   DO NOT TAKE 7 DAYS PRIOR TO TEST- Trulicity (dulaglutide) Ozempic, Wegovy (semaglutide) Mounjaro (tirzepatide) Bydureon Bcise (exanatide extended release)  DO NOT TAKE 1 DAY PRIOR TO YOUR TEST Rybelsus (semaglutide) Adlyxin (lixisenatide) Victoza (liraglutide) Byetta (exanatide) ___________________________________________________________________________  Please go to the lab in the basement of our building to have lab work done as you leave today. Hit "B" for basement when you get on the elevator.  When the doors open the lab is on your left.  We will call you with the results. Thank you.  We will request [facility] records [facility], Dr. Donavan Foil  Thank you for entrusting me with your care and for choosing [facility], Dr. Erick Blinks    If your blood pressure at your visit was 140/90 or greater, please contact your primary care physician to follow up on this. ______________________________________________________  If you are age 56 or older, your body mass index should be between 23-30. Your Body mass index is 29.01 kg/m. If this is out of the aforementioned range listed, please consider follow up with your Primary Care Provider.  If you are age 69 or younger, your body mass index should be between 19-25. Your Body mass index is 29.01 kg/m. If this is out of the aformentioned range listed, please consider follow up with your Primary Care Provider.  ________________________________________________________  The [facility] providers would like to encourage you to use [facility] to communicate with providers for non-urgent requests or  questions.  Due to long hold times on the telephone, sending your provider a message by [facility] may be a faster and more efficient way to get a response.  Please allow 48 business hours for a response.  Please remember that this is for non-urgent requests.  _______________________________________________________  Due to recent changes in healthcare laws, you may see the results of your imaging and laboratory studies on MyChart before your provider has had a chance to review them.  We understand that in some cases there may be results that are confusing or concerning to you. Not all laboratory results come back in the same time frame and the provider may be waiting for multiple results in order to interpret others.  Please give [place] 48 hours in order for your provider to thoroughly review all the results before contacting the office for clarification of your results.

## 2023-03-29 ENCOUNTER — Ambulatory Visit (HOSPITAL_COMMUNITY)
Admission: RE | Admit: 2023-03-29 | Discharge: 2023-03-29 | Disposition: A | Discharge status: Home / Self Care | Payer: BC Managed Care – PPO | Source: Ambulatory Visit | Attending: Obstetrics & Gynecology | Admitting: Obstetrics & Gynecology

## 2023-03-29 ENCOUNTER — Ambulatory Visit (HOSPITAL_COMMUNITY): Admission: RE | Admit: 2023-03-29 | Payer: BC Managed Care – PPO | Source: Ambulatory Visit

## 2023-03-29 ENCOUNTER — Telehealth: Payer: Self-pay | Admitting: Internal Medicine

## 2023-03-29 ENCOUNTER — Encounter (HOSPITAL_COMMUNITY): Payer: Self-pay

## 2023-03-29 DIAGNOSIS — Z1231 Encounter for screening mammogram for malignant neoplasm of breast: Secondary | ICD-10-CM | POA: Insufficient documentation

## 2023-03-29 NOTE — Telephone Encounter (Signed)
Dr. Rhea Belton has received and reviewed colonoscopy and pathology reports. Left detailed voicemail letting patient know that they have been reviewed and to continue with colonoscopy as planned.

## 2023-03-29 NOTE — Telephone Encounter (Signed)
Pathology Report received and scanned into Media for review.

## 2023-03-30 ENCOUNTER — Other Ambulatory Visit (HOSPITAL_COMMUNITY): Payer: Self-pay | Admitting: Obstetrics & Gynecology

## 2023-03-30 DIAGNOSIS — R928 Other abnormal and inconclusive findings on diagnostic imaging of breast: Secondary | ICD-10-CM

## 2023-04-04 ENCOUNTER — Ambulatory Visit (HOSPITAL_COMMUNITY)
Admission: RE | Admit: 2023-04-04 | Discharge: 2023-04-04 | Disposition: A | Discharge status: Home / Self Care | Payer: BC Managed Care – PPO | Source: Ambulatory Visit | Attending: Obstetrics & Gynecology | Admitting: Obstetrics & Gynecology

## 2023-04-04 ENCOUNTER — Encounter (HOSPITAL_COMMUNITY): Payer: Self-pay

## 2023-04-04 DIAGNOSIS — R92321 Mammographic fibroglandular density, right breast: Secondary | ICD-10-CM | POA: Diagnosis not present

## 2023-04-04 DIAGNOSIS — R928 Other abnormal and inconclusive findings on diagnostic imaging of breast: Secondary | ICD-10-CM | POA: Insufficient documentation

## 2023-04-26 DIAGNOSIS — Z1283 Encounter for screening for malignant neoplasm of skin: Secondary | ICD-10-CM | POA: Diagnosis not present

## 2023-04-26 DIAGNOSIS — D225 Melanocytic nevi of trunk: Secondary | ICD-10-CM | POA: Diagnosis not present

## 2023-05-10 ENCOUNTER — Encounter: Payer: Self-pay | Admitting: Internal Medicine

## 2023-05-11 ENCOUNTER — Ambulatory Visit: Payer: BC Managed Care – PPO

## 2023-05-11 DIAGNOSIS — R1013 Epigastric pain: Secondary | ICD-10-CM | POA: Diagnosis not present

## 2023-05-11 DIAGNOSIS — K51919 Ulcerative colitis, unspecified with unspecified complications: Secondary | ICD-10-CM | POA: Diagnosis not present

## 2023-05-14 LAB — CALPROTECTIN, FECAL: Calprotectin, Fecal: 14 ug/g (ref 0–120)

## 2023-05-23 ENCOUNTER — Other Ambulatory Visit: Payer: Self-pay

## 2023-05-23 ENCOUNTER — Ambulatory Visit: Payer: BC Managed Care – PPO | Admitting: Internal Medicine

## 2023-05-23 ENCOUNTER — Encounter (HOSPITAL_BASED_OUTPATIENT_CLINIC_OR_DEPARTMENT_OTHER): Payer: Self-pay | Admitting: Surgery

## 2023-05-23 ENCOUNTER — Encounter: Payer: Self-pay | Admitting: Internal Medicine

## 2023-05-23 VITALS — BP 121/71 | HR 64 | Temp 97.3°F | Resp 18 | Ht 67.0 in | Wt 185.0 lb

## 2023-05-23 DIAGNOSIS — K2970 Gastritis, unspecified, without bleeding: Secondary | ICD-10-CM

## 2023-05-23 DIAGNOSIS — R1013 Epigastric pain: Secondary | ICD-10-CM | POA: Diagnosis not present

## 2023-05-23 DIAGNOSIS — K51919 Ulcerative colitis, unspecified with unspecified complications: Secondary | ICD-10-CM | POA: Diagnosis not present

## 2023-05-23 DIAGNOSIS — Z8719 Personal history of other diseases of the digestive system: Secondary | ICD-10-CM | POA: Diagnosis not present

## 2023-05-23 DIAGNOSIS — R1084 Generalized abdominal pain: Secondary | ICD-10-CM

## 2023-05-23 DIAGNOSIS — R1011 Right upper quadrant pain: Secondary | ICD-10-CM

## 2023-05-23 DIAGNOSIS — K319 Disease of stomach and duodenum, unspecified: Secondary | ICD-10-CM | POA: Diagnosis not present

## 2023-05-23 MED ORDER — SODIUM CHLORIDE 0.9 % IV SOLN: 500.0000 mL | Freq: Once | INTRAVENOUS | Status: DC

## 2023-05-23 NOTE — Patient Instructions (Addendum)
- Resume previous diet. - Continue present medications. - Repeat colonoscopy in 10 years for screening purposes. - Await pathology results. - See the other procedure note for documentation of additional recommendations. - Patient given educational handouts related to procedure.  YOU HAD AN ENDOSCOPIC PROCEDURE TODAY AT THE [facility]:   Refer to the procedure report that was given to you for any specific questions about what was found during the examination.  If the procedure report does not answer your questions, please call your gastroenterologist to clarify.  If you requested that your care partner not be given the details of your procedure findings, then the procedure report has been included in a sealed envelope for you to review at your convenience later.  YOU SHOULD EXPECT: Some feelings of bloating in the abdomen. Passage of more gas than usual.  Walking can help get rid of the air that was put into your GI tract during the procedure and reduce the bloating. If you had a lower endoscopy (such as a colonoscopy or flexible sigmoidoscopy) you may notice spotting of blood in your stool or on the toilet paper. If you underwent a bowel prep for your procedure, you may not have a normal bowel movement for a few days.  Please Note:  You might notice some irritation and congestion in your nose or some drainage.  This is from the oxygen used during your procedure.  There is no need for concern and it should clear up in a day or so.  SYMPTOMS TO REPORT IMMEDIATELY:  Following lower endoscopy (colonoscopy or flexible sigmoidoscopy):  Excessive amounts of blood in the stool  Significant tenderness or worsening of abdominal pains  Swelling of the abdomen that is new, acute  Fever of 100F or higher  Following upper endoscopy (EGD)  Vomiting of blood or coffee ground material  New chest pain or pain under the shoulder blades  Painful or persistently difficult swallowing  New shortness  of breath  Fever of 100F or higher  Black, tarry-looking stools  For urgent or emergent issues, a gastroenterologist can be reached at any hour by calling [phone]. Do not use MyChart messaging for urgent concerns.    DIET:  We do recommend a small meal at first, but then you may proceed to your regular diet.  Drink plenty of fluids but you should avoid alcoholic beverages for 24 hours.  ACTIVITY:  You should plan to take it easy for the rest of today and you should NOT DRIVE or use heavy machinery until tomorrow (because of the sedation medicines used during the test).    FOLLOW UP: Our staff will call the number listed on your records the next business day following your procedure.  We will call around 7:15- 8:00 am to check on you and address any questions or concerns that you may have regarding the information given to you following your procedure. If we do not reach you, we will leave a message.     If any biopsies were taken you will be contacted by phone or by letter within the next 1-3 weeks.  Please call us at [phone] if you have not heard about the biopsies in 3 weeks.    SIGNATURES/CONFIDENTIALITY: You and/or your care partner have signed paperwork which will be entered into your electronic medical record.  These signatures attest to the fact that that the information above on your After Visit Summary has been reviewed and is understood.  Full responsibility of the confidentiality  of this discharge information lies with you and/or your care-partner.

## 2023-05-23 NOTE — Progress Notes (Unsigned)
GASTROENTEROLOGY PROCEDURE H&P NOTE   Primary Care Physician: Benita Stabile, MD    Reason for Procedure:  Intermittent abdominal pain, mucus in stool, remote ulcerative colitis, right upper quadrant pain and epigastric pain  Plan:    EGD and colonoscopy  Patient is appropriate for endoscopic procedure(s) in the ambulatory ([facility]) setting.  The nature of the procedure, as well as the risks, benefits, and alternatives were carefully and thoroughly reviewed with the patient. Ample time for discussion and questions allowed. The patient understood, was satisfied, and agreed to proceed.     HPI: Alicia Moon is a 42 y.o. female who presents for EGD and colonoscopy.  Medical history as below.  Tolerated the prep.  No recent chest pain or shortness of breath.  No abdominal pain today. Of note during office visit fecal calprotectin was obtained and was normal at 14  Past Medical History:  Diagnosis Date   Biliary colic    Ulcerative colitis [facility])     Past Surgical History:  Procedure Laterality Date   COLONOSCOPY  2010   [facility] [place] Dr Donavan Foil   DILATION AND CURETTAGE OF UTERUS     ESOPHAGOGASTRODUODENOSCOPY  2015   [facility] Dr Donavan Foil    Prior to Admission medications   Medication Sig Start Date End Date Taking? Authorizing Provider  loratadine (CLARITIN) 10 MG tablet Take 10 mg by mouth daily.   Yes [provider]  Multiple Vitamin (MULTIVITAMIN ADULT) TABS 1 tablet daily.   Yes [provider]  Omega-3 Fatty Acids (FISH OIL) 645 MG CAPS 1 tablet daily. 08/15/21  Yes [provider]  SRONYX 0.1-20 MG-MCG tablet Take 1 tablet by mouth daily. 01/25/23 05/23/23 Yes Myna Hidalgo, DO  Zinc 25 MG TABS 1 tablet daily. 08/15/21  Yes [provider]    Current Outpatient Medications  Medication Sig Dispense Refill   loratadine (CLARITIN) 10 MG tablet Take 10 mg by mouth daily.     Multiple Vitamin  (MULTIVITAMIN ADULT) TABS 1 tablet daily.     Omega-3 Fatty Acids (FISH OIL) 645 MG CAPS 1 tablet daily.     SRONYX 0.1-20 MG-MCG tablet Take 1 tablet by mouth daily. 90 tablet 4   Zinc 25 MG TABS 1 tablet daily.     Current Facility-Administered Medications  Medication Dose Route Frequency Provider Last Rate Last Admin   0.9 %  sodium chloride infusion  500 mL Intravenous Once Gaynor Ferreras, Carie Caddy, MD        Allergies as of 05/23/2023 - Review Complete 05/23/2023  Allergen Reaction Noted   Sulfa antibiotics Nausea Only 01/15/2021    Family History  Problem Relation Age of Onset   Colon cancer Maternal Grandmother    Breast cancer Paternal Great-grandmother    Colon cancer Other    Esophageal cancer Neg Hx     Social History   Socioeconomic History   Marital status: Married    Spouse name: Not on file   Number of children: Not on file   Years of education: Not on file   Highest education level: Not on file  Occupational History   Not on file  Tobacco Use   Smoking status: Former    Types: Cigarettes   Smokeless tobacco: Never  Vaping Use   Vaping status: Never Used  Substance and Sexual Activity   Alcohol use: Yes    Comment: occ   Drug use: Never   Sexual activity: Yes    Birth control/protection:  Pill  Other Topics Concern   Not on file  Social History Narrative   Not on file   Social Determinants of Health   Financial Resource Strain: Low Risk  (01/25/2023)   Overall Financial Resource Strain (CARDIA)    Difficulty of Paying Living Expenses: Not hard at all  Food Insecurity: No Food Insecurity (01/25/2023)   Hunger Vital Sign    Worried About Running Out of Food in the Last Year: Never true    Ran Out of Food in the Last Year: Never true  Transportation Needs: No Transportation Needs (01/25/2023)   PRAPARE - Administrator, Civil Service (Medical): No    Lack of Transportation (Non-Medical): No  Physical Activity: Insufficiently Active (01/25/2023)    Exercise Vital Sign    Days of Exercise per Week: 3 days    Minutes of Exercise per Session: 20 min  Stress: No Stress Concern Present (01/25/2023)   [employer] of Occupational Health - Occupational Stress Questionnaire    Feeling of Stress : Not at all  Social Connections: Socially Integrated (01/25/2023)   Social Connection and Isolation Panel [NHANES]    Frequency of Communication with Friends and Family: More than three times a week    Frequency of Social Gatherings with Friends and Family: Once a week    Attends Religious Services: More than 4 times per year    Active Member of [employer] or Organizations: Yes    Attends Engineer, structural: More than 4 times per year    Marital Status: Married  Catering manager Violence: Not At Risk (01/25/2023)   Humiliation, Afraid, Rape, and Kick questionnaire    Fear of Current or Ex-Partner: No    Emotionally Abused: No    Physically Abused: No    Sexually Abused: No    Physical Exam: Vital signs in last 24 hours: @BP  127/75   Pulse 78   Temp (!) 97.3 F (36.3 C)   Ht 5\' 7"  (1.702 m)   Wt 185 lb (83.9 kg)   SpO2 100%   BMI 28.98 kg/m  GEN: NAD EYE: Sclerae anicteric ENT: MMM CV: Non-tachycardic Pulm: CTA b/l GI: Soft, NT/ND NEURO:  Alert & Oriented x 3   Erick Blinks, MD [facility]  05/23/2023 2:27 PM

## 2023-05-23 NOTE — Op Note (Signed)
[facility] Patient Name: Alicia Moon Procedure Date: 05/23/2023 2:22 PM MRN: [id number] Endoscopist: Beverley Fiedler , MD, [id number] Age: 42 Referring MD:  Date of Birth: 18-Feb-1981 Gender: Female Account #: [id number] Procedure:                Colonoscopy Indications:              Generalized abdominal pain, history of previous                            diagnosis of ulcerative colitis, recent fecal                            calprotectin normal at 14 Medicines:                Monitored Anesthesia Care Procedure:                Pre-Anesthesia Assessment:                           - Prior to the procedure, a History and Physical                            was performed, and patient medications and                            allergies were reviewed. The patient's tolerance of                            previous anesthesia was also reviewed. The risks                            and benefits of the procedure and the sedation                            options and risks were discussed with the patient.                            All questions were answered, and informed consent                            was obtained. Prior Anticoagulants: The patient has                            taken no anticoagulant or antiplatelet agents. ASA                            Grade Assessment: II - A patient with mild systemic                            disease. After reviewing the risks and benefits,                            the patient was deemed in satisfactory condition to  undergo the procedure.                           After obtaining informed consent, the colonoscope                            was passed under direct vision. Throughout the                            procedure, the patient's blood pressure, pulse, and                            oxygen saturations were monitored continuously. The                            Olympus PCF-H190DL (#[id number])  Colonoscope was                            introduced through the anus and advanced to the                            terminal ileum. The colonoscopy was performed                            without difficulty. The patient tolerated the                            procedure well. The quality of the bowel                            preparation was excellent. The terminal ileum,                            ileocecal valve, appendiceal orifice, and rectum                            were photographed. Scope In: 2:44:28 PM Scope Out: 2:57:33 PM Scope Withdrawal Time: 0 hours 8 minutes 55 seconds  Total Procedure Duration: 0 hours 13 minutes 5 seconds  Findings:                 The digital rectal exam was normal.                           The terminal ileum appeared normal.                           The entire examined colon appeared normal on direct                            and retroflexion views. Complications:            No immediate complications. Estimated Blood Loss:     Estimated blood loss: none. Impression:               - The examined portion of the ileum was normal.                           -  The entire examined colon is normal on direct and                            retroflexion views.                           - No evidence of colitis.                           - No specimens collected. Recommendation:           - Patient has a contact number available for                            emergencies. The signs and symptoms of potential                            delayed complications were discussed with the                            patient. Return to normal activities tomorrow.                            Written discharge instructions were provided to the                            patient.                           - Resume previous diet.                           - Continue present medications.                           - Repeat colonoscopy in 10 years for screening                             purposes. Beverley Fiedler, MD 05/23/2023 3:03:49 PM This report has been signed electronically.

## 2023-05-23 NOTE — Progress Notes (Unsigned)
Called to room to assist during endoscopic procedure.  Patient ID and intended procedure confirmed with present staff. Received instructions for my participation in the procedure from the performing physician.  

## 2023-05-23 NOTE — Progress Notes (Unsigned)
Report to PACU, RN, vss, BBS= Clear.  

## 2023-05-23 NOTE — Op Note (Signed)
[facility] Patient Name: Alicia Moon Procedure Date: 05/23/2023 2:30 PM MRN: [id number] Endoscopist: Beverley Fiedler , MD, [id number] Age: 42 Referring MD:  Date of Birth: [birth year] Gender: Female Account #: [id number] Procedure:                Upper GI endoscopy Indications:              Epigastric abdominal pain, Abdominal pain in the                            right upper quadrant, known biliary dyskinesia Medicines:                Monitored Anesthesia Care Procedure:                Pre-Anesthesia Assessment:                           - Prior to the procedure, a History and Physical                            was performed, and patient medications and                            allergies were reviewed. The patient's tolerance of                            previous anesthesia was also reviewed. The risks                            and benefits of the procedure and the sedation                            options and risks were discussed with the patient.                            All questions were answered, and informed consent                            was obtained. Prior Anticoagulants: The patient has                            taken no anticoagulant or antiplatelet agents. ASA                            Grade Assessment: II - A patient with mild systemic                            disease. After reviewing the risks and benefits,                            the patient was deemed in satisfactory condition to                            undergo the procedure.  After obtaining informed consent, the endoscope was                            passed under direct vision. Throughout the                            procedure, the patient's blood pressure, pulse, and                            oxygen saturations were monitored continuously. The                            GIF [id number] #[id number] was introduced through the                            mouth,  and advanced to the second part of duodenum.                            The upper GI endoscopy was accomplished without                            difficulty. The patient tolerated the procedure                            well. Scope In: Scope Out: Findings:                 The examined esophagus was normal.                           Scattered mild inflammation characterized by                            erosions and erythema was found in the gastric                            antrum. Biopsies were taken with a cold forceps for                            histology and Helicobacter pylori testing.                           The cardia and gastric fundus were normal on                            retroflexion.                           The examined duodenum was normal. Complications:            No immediate complications. Estimated Blood Loss:     Estimated blood loss was minimal. Impression:               - Normal esophagus.                           - Gastritis. Biopsied.                           -  Normal examined duodenum. Recommendation:           - Patient has a contact number available for                            emergencies. The signs and symptoms of potential                            delayed complications were discussed with the                            patient. Return to normal activities tomorrow.                            Written discharge instructions were provided to the                            patient.                           - Resume previous diet.                           - Continue present medications.                           - Await pathology results.                           - See the other procedure note for documentation of                            additional recommendations. Beverley Fiedler, MD 05/23/2023 3:00:13 PM This report has been signed electronically.

## 2023-05-23 NOTE — Progress Notes (Signed)
Pt's states no medical or surgical changes since previsit or office visit. 

## 2023-05-24 ENCOUNTER — Telehealth: Payer: Self-pay

## 2023-05-24 NOTE — Telephone Encounter (Signed)
  Follow up Call-     05/23/2023    2:18 PM  Call back number  Post procedure Call Back phone  # [phone]  Permission to leave phone message Yes     Patient questions:  Do you have a fever, pain , or abdominal swelling? No. Pain Score  0 *  Have you tolerated food without any problems? Yes.    Have you been able to return to your normal activities? Yes.    Do you have any questions about your discharge instructions: Diet   No. Medications  No. Follow up visit  No.  Do you have questions or concerns about your Care? No.  Actions: * If pain score is 4 or above: No action needed, pain <4.

## 2023-05-26 LAB — SURGICAL PATHOLOGY

## 2023-05-29 ENCOUNTER — Encounter: Payer: Self-pay | Admitting: Internal Medicine

## 2023-05-29 MED ORDER — ENSURE PRE-SURGERY PO LIQD: 296.0000 mL | Freq: Once | ORAL | Status: DC

## 2023-05-29 MED ORDER — CHLORHEXIDINE GLUCONATE CLOTH 2 % EX PADS: 6.0000 | MEDICATED_PAD | Freq: Once | CUTANEOUS | Status: DC

## 2023-05-29 NOTE — Progress Notes (Signed)

## 2023-05-30 NOTE — H&P (Signed)
REFERRING PHYSICIAN: Leone Payor PROVIDER: Wayne Both, MD MRN: [id number] DOB: 06-Dec-1980  Subjective   Chief Complaint: New Consultation  History of Present Illness: Alicia Moon is a 42 y.o. female who is seen  as an office consultation for evaluation of New Consultation  This is a very pleasant 42 year old female who is referred here for biliary colic. Back in 2015 when she was living in [place] she started having attacks of right upper quadrant abdominal pain hurting through to the back with nausea and bloating. She had a workup including multiple ultrasounds which were unremarkable and a HIDA scan which showed a low normal gallbladder ejection fraction. She decided to hold on surgery at that point. She reports she still has about 6 similar attacks every year. She denies jaundice or emesis. She also reports now having over the last 2 months a gritty feeling in her lower chest/epigastric area after some meals. She is not on an acid. She is otherwise healthy without complaints. She has had no previous abdominal surgery.  Review of Systems: A complete review of systems was obtained from the patient. I have reviewed this information and discussed as appropriate with the patient. See HPI as well for other ROS.  ROS   Medical History: History reviewed. No pertinent past medical history.  There is no problem list on file for this patient.  Past Surgical History:  Procedure Laterality Date  DILATION AND CURETTAGE, DIAGNOSTIC / THERAPEUTIC 2010    Allergies  Allergen Reactions  Sulfa (Sulfonamide Antibiotics) Vomiting   Current Outpatient Medications on File Prior to Visit  Medication Sig Dispense Refill  levonorgestrel-ethinyl estradiol (LESSINA) 0.1-20 mg-mcg tablet Take 1 tablet by mouth once daily  multivit with calcium,iron,min (MULTIPLE VITAMIN, WOMENS ORAL)  omega-3-dha-epa-dpa-fish oil 1,050 mg(300 mg -675 mg-75 mg) Cap  zinc sulfate (ZINC-15 ORAL)   No  current facility-administered medications on file prior to visit.   History reviewed. No pertinent family history.   Social History   Tobacco Use  Smoking Status Former  Types: Cigarettes  Start date: 2014  Smokeless Tobacco Never    Social History   Socioeconomic History  Marital status: Married  Tobacco Use  Smoking status: Former  Types: Cigarettes  Start date: 2014  Smokeless tobacco: Never  Substance and Sexual Activity  Alcohol use: Yes  Drug use: Never   Social Determinants of Corporate investment banker Strain: Low Risk (01/25/2023)  Received from [facility]  Overall Financial Resource Strain (CARDIA)  Difficulty of Paying Living Expenses: Not hard at all  Food Insecurity: No Food Insecurity (01/25/2023)  Received from [facility]  Hunger Vital Sign  Worried About Running Out of Food in the Last Year: Never true  Ran Out of Food in the Last Year: Never true  Transportation Needs: No Transportation Needs (01/25/2023)  Received from [facility] - Transportation  Lack of Transportation (Medical): No  Lack of Transportation (Non-Medical): No  Physical Activity: Insufficiently Active (01/25/2023)  Received from [facility]  Exercise Vital Sign  Days of Exercise per Week: 3 days  Minutes of Exercise per Session: 20 min  Stress: No Stress Concern Present (01/25/2023)  Received from [facility] - Occupational Stress Questionnaire  Feeling of Stress : Not at all  Social Connections: Socially Integrated (01/25/2023)  Received from [facility]  Social Connection and Isolation Panel [NHANES]  Frequency of Communication with Friends and Family: More than three times a week  Frequency of  Social Gatherings with Friends and Family: Once a week  Attends Religious Services: More than 4 times per year  Active Member of [employer] or Organizations: Yes  Attends Engineer, structural: More than 4 times per year  Marital  Status: Married   Objective:   Vitals:   BP: (!) 150/84  Pulse: 77  Temp: 36.9 C (98.4 F)  SpO2: 99%  Weight: 84.4 kg (186 lb)  Height: 170.2 cm (5\' 7" )  PainSc: 0-No pain  PainLoc: Abdomen   Body mass index is 29.13 kg/m.  Physical Exam   She appears well on exam  Her abdomen is soft. There is very slight tenderness in the right upper quadrant but no significant guarding.  There is no hepatomegaly or abdominal masses  Labs, Imaging and Diagnostic Testing: I have reviewed her notes in the electronic medical records  Assessment and Plan:   Diagnoses and all orders for this visit:  Biliary colic - US gallbladder   We discussed biliary colic, gallstones, and biliary dyskinesia. As it is approaching 10 years since her most recent studies, I believe she does need a repeat ultrasound of her abdomen to see if she has developed gallstones. She already has an appointment to see gastroenterology as well and they may consider an upper endoscopy given her symptoms which could be consistent with a hiatal hernia. I will call her back with the results of the ultrasound and we will determine whether or not to proceed with gallbladder surgery depending on the findings versus repeating a HIDA scan. She understands and agrees with the plans.  Addendum: Her ultrasound was unremarkable.  We suspect this is biliary dyskinesia biliary colic.  Plan will be to proceed to the operating room for laparoscopic cholecystectomy.  We again discussed the risks.  Which includes but is not limited to bleeding, infection, injury to surrounding structures, bile duct injury, bile leak, the need to convert to an open procedure, the chances may not resolve all of her symptoms, postoperative recovery, etc.  She understands and wished to proceed with surgery which will be scheduled

## 2023-05-31 ENCOUNTER — Other Ambulatory Visit: Payer: Self-pay

## 2023-05-31 ENCOUNTER — Encounter (HOSPITAL_BASED_OUTPATIENT_CLINIC_OR_DEPARTMENT_OTHER): Payer: Self-pay | Admitting: Surgery

## 2023-05-31 ENCOUNTER — Ambulatory Visit (HOSPITAL_BASED_OUTPATIENT_CLINIC_OR_DEPARTMENT_OTHER)
Admission: RE | Admit: 2023-05-31 | Discharge: 2023-05-31 | Disposition: A | Discharge status: Home / Self Care | Payer: BC Managed Care – PPO | Attending: Surgery | Admitting: Surgery

## 2023-05-31 ENCOUNTER — Encounter (HOSPITAL_BASED_OUTPATIENT_CLINIC_OR_DEPARTMENT_OTHER)
Admission: RE | Disposition: A | Discharge status: Home / Self Care | Payer: Self-pay | Source: Home / Self Care | Attending: Surgery

## 2023-05-31 ENCOUNTER — Ambulatory Visit (HOSPITAL_BASED_OUTPATIENT_CLINIC_OR_DEPARTMENT_OTHER): Payer: BC Managed Care – PPO | Admitting: Anesthesiology

## 2023-05-31 DIAGNOSIS — K811 Chronic cholecystitis: Secondary | ICD-10-CM | POA: Insufficient documentation

## 2023-05-31 DIAGNOSIS — Z87891 Personal history of nicotine dependence: Secondary | ICD-10-CM | POA: Insufficient documentation

## 2023-05-31 DIAGNOSIS — Z01818 Encounter for other preprocedural examination: Secondary | ICD-10-CM

## 2023-05-31 DIAGNOSIS — Z793 Long term (current) use of hormonal contraceptives: Secondary | ICD-10-CM | POA: Insufficient documentation

## 2023-05-31 DIAGNOSIS — K8010 Calculus of gallbladder with chronic cholecystitis without obstruction: Secondary | ICD-10-CM | POA: Diagnosis not present

## 2023-05-31 HISTORY — DX: Calculus of bile duct without cholangitis or cholecystitis without obstruction: K80.50

## 2023-05-31 HISTORY — PX: CHOLECYSTECTOMY: SHX55

## 2023-05-31 LAB — POCT PREGNANCY, URINE: Preg Test, Ur: NEGATIVE

## 2023-05-31 SURGERY — LAPAROSCOPIC CHOLECYSTECTOMY
Anesthesia: General | Site: Abdomen

## 2023-05-31 MED ORDER — CEFAZOLIN SODIUM-DEXTROSE 2-4 GM/100ML-% IV SOLN
2.0000 g | INTRAVENOUS | Status: AC
  Administered 2023-05-31: 2 g via INTRAVENOUS

## 2023-05-31 MED ORDER — OXYCODONE HCL 5 MG PO TABS
5.0000 mg | ORAL_TABLET | Freq: Four times a day (QID) | ORAL | 0 refills | Status: AC | PRN
Start: 2023-05-31 — End: ?

## 2023-05-31 MED ORDER — ONDANSETRON HCL 4 MG/2ML IJ SOLN
INTRAMUSCULAR | Status: AC
  Filled 2023-05-31: qty 2

## 2023-05-31 MED ORDER — ROCURONIUM BROMIDE 100 MG/10ML IV SOLN
INTRAVENOUS | Status: DC | PRN
  Administered 2023-05-31: 10 mg via INTRAVENOUS
  Administered 2023-05-31: 50 mg via INTRAVENOUS

## 2023-05-31 MED ORDER — LIDOCAINE HCL (CARDIAC) PF 100 MG/5ML IV SOSY
PREFILLED_SYRINGE | INTRAVENOUS | Status: DC | PRN
  Administered 2023-05-31: 60 mg via INTRAVENOUS

## 2023-05-31 MED ORDER — PROPOFOL 500 MG/50ML IV EMUL
INTRAVENOUS | Status: AC
  Filled 2023-05-31: qty 50

## 2023-05-31 MED ORDER — BUPIVACAINE-EPINEPHRINE (PF) 0.5% -1:200000 IJ SOLN
INTRAMUSCULAR | Status: DC | PRN
  Administered 2023-05-31: 20 mL

## 2023-05-31 MED ORDER — ONDANSETRON HCL 4 MG/2ML IJ SOLN
INTRAMUSCULAR | Status: DC | PRN
  Administered 2023-05-31: 4 mg via INTRAVENOUS

## 2023-05-31 MED ORDER — PROPOFOL 10 MG/ML IV BOLUS
INTRAVENOUS | Status: DC | PRN
  Administered 2023-05-31: 170 mg via INTRAVENOUS

## 2023-05-31 MED ORDER — DEXAMETHASONE SODIUM PHOSPHATE 4 MG/ML IJ SOLN
INTRAMUSCULAR | Status: DC | PRN
  Administered 2023-05-31: 10 mg via INTRAVENOUS

## 2023-05-31 MED ORDER — OXYCODONE HCL 5 MG/5ML PO SOLN: 5.0000 mg | Freq: Once | ORAL | Status: AC | PRN

## 2023-05-31 MED ORDER — BUPIVACAINE-EPINEPHRINE (PF) 0.5% -1:200000 IJ SOLN
INTRAMUSCULAR | Status: AC
  Filled 2023-05-31: qty 150

## 2023-05-31 MED ORDER — FENTANYL CITRATE (PF) 100 MCG/2ML IJ SOLN
INTRAMUSCULAR | Status: AC
  Filled 2023-05-31: qty 2

## 2023-05-31 MED ORDER — OXYCODONE HCL 5 MG PO TABS
ORAL_TABLET | ORAL | Status: AC
  Filled 2023-05-31: qty 1

## 2023-05-31 MED ORDER — CEFAZOLIN SODIUM-DEXTROSE 2-4 GM/100ML-% IV SOLN
INTRAVENOUS | Status: AC
  Filled 2023-05-31: qty 100

## 2023-05-31 MED ORDER — ONDANSETRON HCL 4 MG/2ML IJ SOLN
4.0000 mg | Freq: Four times a day (QID) | INTRAMUSCULAR | Status: AC | PRN
  Administered 2023-05-31: 4 mg via INTRAVENOUS

## 2023-05-31 MED ORDER — MIDAZOLAM HCL 2 MG/2ML IJ SOLN
INTRAMUSCULAR | Status: AC
  Filled 2023-05-31: qty 2

## 2023-05-31 MED ORDER — FENTANYL CITRATE (PF) 100 MCG/2ML IJ SOLN
INTRAMUSCULAR | Status: DC | PRN
  Administered 2023-05-31: 50 ug via INTRAVENOUS

## 2023-05-31 MED ORDER — MIDAZOLAM HCL 5 MG/5ML IJ SOLN
INTRAMUSCULAR | Status: DC | PRN
  Administered 2023-05-31: 2 mg via INTRAVENOUS

## 2023-05-31 MED ORDER — ACETAMINOPHEN 500 MG PO TABS
1000.0000 mg | ORAL_TABLET | ORAL | Status: AC
  Administered 2023-05-31: 1000 mg via ORAL

## 2023-05-31 MED ORDER — LACTATED RINGERS IV SOLN: INTRAVENOUS | Status: DC

## 2023-05-31 MED ORDER — SODIUM CHLORIDE 0.9 % IR SOLN
Status: DC | PRN
  Administered 2023-05-31: 600 mL

## 2023-05-31 MED ORDER — DEXAMETHASONE SODIUM PHOSPHATE 10 MG/ML IJ SOLN
INTRAMUSCULAR | Status: AC
  Filled 2023-05-31: qty 1

## 2023-05-31 MED ORDER — SUGAMMADEX SODIUM 200 MG/2ML IV SOLN
INTRAVENOUS | Status: DC | PRN
Start: 2023-05-31 — End: 2023-05-31
  Administered 2023-05-31: 200 mg via INTRAVENOUS

## 2023-05-31 MED ORDER — ONDANSETRON HCL 4 MG PO TABS: 4.0000 mg | ORAL_TABLET | Freq: Three times a day (TID) | ORAL | 0 refills | Status: DC | PRN

## 2023-05-31 MED ORDER — LIDOCAINE 2% (20 MG/ML) 5 ML SYRINGE
INTRAMUSCULAR | Status: AC
  Filled 2023-05-31: qty 5

## 2023-05-31 MED ORDER — ACETAMINOPHEN 500 MG PO TABS
ORAL_TABLET | ORAL | Status: AC
  Filled 2023-05-31: qty 2

## 2023-05-31 MED ORDER — FENTANYL CITRATE (PF) 100 MCG/2ML IJ SOLN
25.0000 ug | INTRAMUSCULAR | Status: DC | PRN
  Administered 2023-05-31: 50 ug via INTRAVENOUS

## 2023-05-31 MED ORDER — OXYCODONE HCL 5 MG PO TABS
5.0000 mg | ORAL_TABLET | Freq: Once | ORAL | Status: AC | PRN
  Administered 2023-05-31: 5 mg via ORAL

## 2023-05-31 SURGICAL SUPPLY — 48 items
ADH SKN CLS APL DERMABOND .7 (GAUZE/BANDAGES/DRESSINGS) ×1
APL PRP STRL LF DISP 70% ISPRP (MISCELLANEOUS) ×1
APL SRG 38 LTWT LNG FL B (MISCELLANEOUS) ×2
APPLICATOR ARISTA FLEXITIP XL (MISCELLANEOUS) IMPLANT
APPLIER CLIP 5 13 M/L LIGAMAX5 (MISCELLANEOUS) ×1
APR CLP MED LRG 5 ANG JAW (MISCELLANEOUS) ×1
CHLORAPREP W/TINT 26 (MISCELLANEOUS) ×1 IMPLANT
CLIP APPLIE 5 13 M/L LIGAMAX5 (MISCELLANEOUS) ×1 IMPLANT
COVER MAYO STAND STRL (DRAPES) IMPLANT
DERMABOND ADVANCED .7 DNX12 (GAUZE/BANDAGES/DRESSINGS) ×1 IMPLANT
DRAPE C-ARM 42X72 X-RAY (DRAPES) IMPLANT
ELECT REM PT RETURN 9FT ADLT (ELECTROSURGICAL) ×1
ELECTRODE REM PT RTRN 9FT ADLT (ELECTROSURGICAL) ×1 IMPLANT
GAUZE 4X4 16PLY ~~LOC~~+RFID DBL (SPONGE) ×1 IMPLANT
GLOVE BIOGEL PI IND STRL 6.5 (GLOVE) IMPLANT
GLOVE BIOGEL PI IND STRL 7.5 (GLOVE) IMPLANT
GLOVE ECLIPSE 6.5 STRL STRAW (GLOVE) IMPLANT
GLOVE SURG SIGNA 7.5 PF LTX (GLOVE) ×1 IMPLANT
GLOVE SURG SS PI 7.0 STRL IVOR (GLOVE) IMPLANT
GLOVE SURG SS PI 7.5 STRL IVOR (GLOVE) IMPLANT
GOWN STRL REUS W/ TWL LRG LVL3 (GOWN DISPOSABLE) ×2 IMPLANT
GOWN STRL REUS W/ TWL XL LVL3 (GOWN DISPOSABLE) ×1 IMPLANT
GOWN STRL REUS W/TWL LRG LVL3 (GOWN DISPOSABLE) ×3
GOWN STRL REUS W/TWL XL LVL3 (GOWN DISPOSABLE) ×2
HEMOSTAT ARISTA ABSORB 3G PWDR (HEMOSTASIS) IMPLANT
HEMOSTAT SNOW SURGICEL 2X4 (HEMOSTASIS) IMPLANT
IRRIG SUCT STRYKERFLOW 2 WTIP (MISCELLANEOUS) ×2
IRRIGATION SUCT STRKRFLW 2 WTP (MISCELLANEOUS) ×1 IMPLANT
IV NS 500ML (IV SOLUTION) ×2
IV NS 500ML BAXH (IV SOLUTION) IMPLANT
NS IRRIG 1000ML POUR BTL (IV SOLUTION) ×1 IMPLANT
PACK BASIN DAY SURGERY FS (CUSTOM PROCEDURE TRAY) ×1 IMPLANT
SCISSORS LAP 5X35 DISP (ENDOMECHANICALS) ×1 IMPLANT
SET CHOLANGIOGRAPH 5 50 .035 (SET/KITS/TRAYS/PACK) IMPLANT
SET TUBE SMOKE EVAC HIGH FLOW (TUBING) ×1 IMPLANT
SLEEVE SCD COMPRESS KNEE MED (STOCKING) ×1 IMPLANT
SLEEVE Z-THREAD 5X100MM (TROCAR) ×2 IMPLANT
SPECIMEN JAR SMALL (MISCELLANEOUS) ×1 IMPLANT
SPIKE FLUID TRANSFER (MISCELLANEOUS) ×1 IMPLANT
SUT MON AB 4-0 PC3 18 (SUTURE) ×1 IMPLANT
SUT VICRYL 0 UR6 27IN ABS (SUTURE) IMPLANT
SYS BAG RETRIEVAL 10MM (BASKET) ×1
SYSTEM BAG RETRIEVAL 10MM (BASKET) ×1 IMPLANT
TOWEL GREEN STERILE FF (TOWEL DISPOSABLE) ×1 IMPLANT
TRAY LAPAROSCOPIC (CUSTOM PROCEDURE TRAY) ×1 IMPLANT
TROCAR BALLN 12MMX100 BLUNT (TROCAR) ×1 IMPLANT
TROCAR Z-THREAD OPTICAL 5X100M (TROCAR) ×1 IMPLANT
TUBE CONNECTING 20X1/4 (TUBING) ×1 IMPLANT

## 2023-05-31 NOTE — Discharge Instructions (Addendum)
[facility] ______[facility] LAPAROSCOPIC SURGERY: POST OP INSTRUCTIONS Always review your discharge instruction sheet given to you by the facility where your surgery was performed. IF YOU HAVE DISABILITY OR FAMILY LEAVE FORMS, YOU MUST BRING THEM TO THE OFFICE FOR PROCESSING.   DO NOT GIVE THEM TO YOUR DOCTOR.  A prescription for pain medication may be given to you upon discharge.  Take your pain medication as prescribed, if needed.  If narcotic pain medicine is not needed, then you may take acetaminophen (Tylenol) or ibuprofen (Advil) as needed. Take your usually prescribed medications unless otherwise directed. If you need a refill on your pain medication, please contact your pharmacy.  They will contact our office to request authorization. Prescriptions will not be filled after 5pm or on week-ends. You should follow a light diet the first few days after arrival home, such as soup and crackers, etc.  Be sure to include lots of fluids daily. Most patients will experience some swelling and bruising in the area of the incisions.  Ice packs will help.  Swelling and bruising can take several days to resolve.  It is common to experience some constipation if taking pain medication after surgery.  Increasing fluid intake and taking a stool softener (such as Colace) will usually help or prevent this problem from occurring.  A mild laxative (Milk of Magnesia or Miralax) should be taken according to package instructions if there are no bowel movements after 48 hours. Unless discharge instructions indicate otherwise, you may remove your bandages 24-48 hours after surgery, and you may shower at that time.  You may have steri-strips (small skin tapes) in place directly over the incision.  These strips should be left on the skin for 7-10 days.  If your surgeon used skin glue on the incision, you may shower in 24 hours.  The glue will flake off over the next 2-3 weeks.  Any sutures or staples will be  removed at the office during your follow-up visit. ACTIVITIES:  You may resume regular (light) daily activities beginning the next day--such as daily self-care, walking, climbing stairs--gradually increasing activities as tolerated.  You may have sexual intercourse when it is comfortable.  Refrain from any heavy lifting or straining until approved by your doctor. You may drive when you are no longer taking prescription pain medication, you can comfortably wear a seatbelt, and you can safely maneuver your car and apply brakes. RETURN TO WORK:  __________________________________________________________ Bonita Quin should see your doctor in the office for a follow-up appointment approximately 2-3 weeks after your surgery.  Make sure that you call for this appointment within a day or two after you arrive home to insure a convenient appointment time. OTHER INSTRUCTIONS: YOU MAY SHOWER STARTING TOMORROW ICE PACK, TYLENOL,  ALSO FOR PAIN NO LIFTING MORE THAN 15 POUNDS FOR 2 WEEKS _________________________________________________________________________________________________________________________ __________________________________________________________________________________________________________________________ WHEN TO CALL YOUR DOCTOR: Fever over 101.0 Inability to urinate Continued bleeding from incision. Increased pain, redness, or drainage from the incision. Increasing abdominal pain  The clinic staff is available to answer your questions during regular business hours.  Please don't hesitate to call and ask to speak to one of the nurses for clinical concerns.  If you have a medical emergency, go to the nearest emergency room or call 911.  A surgeon from [facility] is always on call at the hospital. [address], [address], [place], [place]  [address] ? [address] [address], [place], [place]   [address] [phone] ? [phone] ? FAX [phone] Web site:  [email or web address]   Post Anesthesia Home Care Instructions  Activity: Get plenty of rest for the remainder of the day. A responsible individual must stay with you for 24 hours following the procedure.  For the next 24 hours, DO NOT: -Drive a car -Advertising copywriter -Drink alcoholic beverages -Take any medication unless instructed by your physician -Make any legal decisions or sign important papers.  Meals: Start with liquid foods such as gelatin or soup. Progress to regular foods as tolerated. Avoid greasy, spicy, heavy foods. If nausea and/or vomiting occur, drink only clear liquids until the nausea and/or vomiting subsides. Call your physician if vomiting continues.  Special Instructions/Symptoms: Your throat may feel dry or sore from the anesthesia or the breathing tube placed in your throat during surgery. If this causes discomfort, gargle with warm salt water. The discomfort should disappear within 24 hours.  If you had a scopolamine patch placed behind your ear for the management of post- operative nausea and/or vomiting:  1. The medication in the patch is effective for 72 hours, after which it should be removed.  Wrap patch in a tissue and discard in the trash. Wash hands thoroughly with soap and water. 2. You may remove the patch earlier than 72 hours if you experience unpleasant side effects which may include dry mouth, dizziness or visual disturbances. 3. Avoid touching the patch. Wash your hands with soap and water after contact with the patch.    May have Tylenol at 12:52pm if needed. You may take Ibuprofen/NSAIDS again after 11am 06/01/23.

## 2023-05-31 NOTE — Op Note (Signed)
LAPAROSCOPIC CHOLECYSTECTOMY  Procedure Note  Bassheva Flury 05/31/2023   Pre-op Diagnosis: CHRONIC CHOLECYSTITIS     Post-op Diagnosis: same  Procedure(s): LAPAROSCOPIC CHOLECYSTECTOMY  Surgeon(s): Abigail Miyamoto, MD Lysle Rubens, MD  Anesthesia: General  Staff:  Circulator: Maryan Rued, RN Scrub Person: Burroughs, Larry Sierras, RN; Efrain Sella, RN  Estimated Blood Loss: 250 cc               Specimens: sent to path  Findings: The patient was found of a mildly thick-walled gallbladder consistent with chronic cholecystitis.  The liver appeared normal.   Procedure: The patient was brought to the operating room and identified as correct patient.  She was placed upon the operating table and general anesthesia was induced.  Her abdomen was then prepped and draped in the usual sterile fashion.  Using a #15 blade a small vertical incision was made below the umbilicus.  This was carried down to the fascia which was again up with a scalpel.  We then gained entrance to the abdominal cavity through the peritoneum under direct vision.  A 0 Vicryl pursing suture was placed around the fascial opening.  The [facility] port was placed the opening and insufflation of the abdomen was began.  We next placed a 5 mm trocar in the patient's epigastrium and 2 more in the right upper quadrant all under direct vision.  The gallbladder was identified.  It appeared chronically thick-walled but only mildly so.  The liver appeared normal.  We then retracted the gallbladder by the liver bed.  Dissection was then carried out the base of the gallbladder.  The cystic duct was dissected out and a critical window was achieved around it.  It was clipped 3 times proximally once distally and transected.  The cystic artery and posterior branch were then identified and clipped proximally distally and transected as well.  The gallbladder was then slowly dissected free from the liver bed with  electrocautery.  A venous lake in the lateral edge of the gallbladder fossa was encountered.  There was vigorous bleeding from this area.  We were able to control the bleeding with the cautery as well as Arista powder and surgical snow.  We irrigated the abdomen normal saline.  We checked the gallbladder fossa several times and again hemostasis appeared to be achieved.  After observing this area for more than 10 minutes, again hemostasis peer to be achieved.  All ports were removed under direct vision and the abdomen was deflated.  The 0 Vicryl the umbilicus was tied in place closing the fascial defect.  All incisions anesthetized with Marcaine and closed with 4-0 Monocryl sutures.  Dermabond was then applied.  The patient tolerated the procedure.  All the counts were correct at the end of the procedure.  She was then taken hemodynamically stable from the operating room to the recovery room.          Abigail Miyamoto   Date: 05/31/2023  Time: 9:39 AM

## 2023-05-31 NOTE — Transfer of Care (Signed)
Immediate Anesthesia Transfer of Care Note  Patient: Alicia Moon  Procedure(s) Performed: LAPAROSCOPIC CHOLECYSTECTOMY (Abdomen)  Patient Location: PACU  Anesthesia Type:General  Level of Consciousness: awake, alert , and oriented  Airway & Oxygen Therapy: Patient Spontanous Breathing, Patient connected to nasal cannula oxygen, and Patient connected to face mask oxygen  Post-op Assessment: Report given to RN and Post -op Vital signs reviewed and stable  Post vital signs: Reviewed and stable  Last Vitals:  Vitals Value Taken Time  BP 131/62 05/31/23 0949  Temp    Pulse 76 05/31/23 0952  Resp 21 05/31/23 0952  SpO2 100 % 05/31/23 0952  Vitals shown include unfiled device data.  Last Pain:  Vitals:   05/31/23 0650  TempSrc: Temporal  PainSc: 0-No pain      Patients Stated Pain Goal: 5 (05/31/23 0650)  Complications: No notable events documented.

## 2023-05-31 NOTE — Interval H&P Note (Signed)
History and Physical Interval Note:no change in H and P  05/31/2023 8:10 AM  Alicia Moon  has presented today for surgery, with the diagnosis of CHRONIC CHOLECYSTITIS.  The various methods of treatment have been discussed with the patient and family. After consideration of risks, benefits and other options for treatment, the patient has consented to  Procedure(s): LAPAROSCOPIC CHOLECYSTECTOMY (N/A) as a surgical intervention.  The patient's history has been reviewed, patient examined, no change in status, stable for surgery.  I have reviewed the patient's chart and labs.  Questions were answered to the patient's satisfaction.     Alicia Moon

## 2023-05-31 NOTE — Anesthesia Procedure Notes (Signed)
Procedure Name: Intubation Date/Time: 05/31/2023 8:33 AM  Performed by: Burna Cash, CRNAPre-anesthesia Checklist: Patient identified, Emergency Drugs available, Suction available and Patient being monitored Patient Re-evaluated:Patient Re-evaluated prior to induction Oxygen Delivery Method: Circle system utilized Preoxygenation: Pre-oxygenation with 100% oxygen Induction Type: IV induction Ventilation: Mask ventilation without difficulty Laryngoscope Size: Mac and 4 Grade View: Grade I Tube type: Oral Tube size: 7.0 mm Number of attempts: 1 Airway Equipment and Method: Stylet and Oral airway Placement Confirmation: ETT inserted through vocal cords under direct vision, positive ETCO2 and breath sounds checked- equal and bilateral Secured at: 21 cm Tube secured with: Tape Dental Injury: Teeth and Oropharynx as per pre-operative assessment

## 2023-05-31 NOTE — Anesthesia Preprocedure Evaluation (Signed)
Anesthesia Evaluation  Patient identified by MRN, date of birth, ID band Patient awake    Reviewed: Allergy & Precautions, H&P , NPO status , Patient's Chart, lab work & pertinent test results  Airway Mallampati: II   Neck ROM: full    Dental   Pulmonary former smoker   breath sounds clear to auscultation       Cardiovascular negative cardio ROS  Rhythm:regular Rate:Normal     Neuro/Psych    GI/Hepatic PUD,,,Ulcerative colitis   Endo/Other    Renal/GU      Musculoskeletal   Abdominal   Peds  Hematology   Anesthesia Other Findings   Reproductive/Obstetrics                             Anesthesia Physical Anesthesia Plan  ASA: 2  Anesthesia Plan: General   Post-op Pain Management:    Induction: Intravenous  PONV Risk Score and Plan: 3 and Ondansetron, Dexamethasone, Midazolam and Treatment may vary due to age or medical condition  Airway Management Planned: Oral ETT  Additional Equipment:   Intra-op Plan:   Post-operative Plan: Extubation in OR  Informed Consent: I have reviewed the patients History and Physical, chart, labs and discussed the procedure including the risks, benefits and alternatives for the proposed anesthesia with the patient or authorized representative who has indicated his/her understanding and acceptance.     Dental advisory given  Plan Discussed with: Anesthesiologist, CRNA and Surgeon  Anesthesia Plan Comments:        Anesthesia Quick Evaluation

## 2023-05-31 NOTE — Anesthesia Postprocedure Evaluation (Signed)
Anesthesia Post Note  Patient: Alicia Moon  Procedure(s) Performed: LAPAROSCOPIC CHOLECYSTECTOMY (Abdomen)     Patient location during evaluation: PACU Anesthesia Type: General Level of consciousness: awake and alert Pain management: pain level controlled Vital Signs Assessment: post-procedure vital signs reviewed and stable Respiratory status: spontaneous breathing, nonlabored ventilation, respiratory function stable and patient connected to nasal cannula oxygen Cardiovascular status: blood pressure returned to baseline and stable Postop Assessment: no apparent nausea or vomiting Anesthetic complications: no   No notable events documented.  Last Vitals:  Vitals:   05/31/23 1221 05/31/23 1345  BP: 114/62 132/66  Pulse: 71 68  Resp:  16  Temp:    SpO2: 100% 100%    Last Pain:  Vitals:   05/31/23 1345  TempSrc:   PainSc: 3                  Montasia Chisenhall S

## 2023-06-01 ENCOUNTER — Encounter (HOSPITAL_BASED_OUTPATIENT_CLINIC_OR_DEPARTMENT_OTHER): Payer: Self-pay | Admitting: Surgery

## 2023-06-01 LAB — SURGICAL PATHOLOGY

## 2023-10-20 NOTE — Telephone Encounter (Signed)
 1

## 2023-10-31 DIAGNOSIS — Z1283 Encounter for screening for malignant neoplasm of skin: Secondary | ICD-10-CM | POA: Diagnosis not present

## 2023-10-31 DIAGNOSIS — D225 Melanocytic nevi of trunk: Secondary | ICD-10-CM | POA: Diagnosis not present

## 2023-11-06 DIAGNOSIS — D485 Neoplasm of uncertain behavior of skin: Secondary | ICD-10-CM | POA: Diagnosis not present

## 2023-11-06 DIAGNOSIS — C44319 Basal cell carcinoma of skin of other parts of face: Secondary | ICD-10-CM | POA: Diagnosis not present

## 2023-11-06 DIAGNOSIS — D225 Melanocytic nevi of trunk: Secondary | ICD-10-CM | POA: Diagnosis not present

## 2023-11-06 DIAGNOSIS — C4441 Basal cell carcinoma of skin of scalp and neck: Secondary | ICD-10-CM | POA: Diagnosis not present

## 2023-12-13 DIAGNOSIS — Z08 Encounter for follow-up examination after completed treatment for malignant neoplasm: Secondary | ICD-10-CM | POA: Diagnosis not present

## 2023-12-13 DIAGNOSIS — D485 Neoplasm of uncertain behavior of skin: Secondary | ICD-10-CM | POA: Diagnosis not present

## 2023-12-13 DIAGNOSIS — L988 Other specified disorders of the skin and subcutaneous tissue: Secondary | ICD-10-CM | POA: Diagnosis not present

## 2023-12-13 DIAGNOSIS — Z85828 Personal history of other malignant neoplasm of skin: Secondary | ICD-10-CM | POA: Diagnosis not present

## 2024-01-26 ENCOUNTER — Ambulatory Visit: Admitting: Obstetrics & Gynecology

## 2024-01-26 ENCOUNTER — Encounter: Payer: Self-pay | Admitting: Obstetrics & Gynecology

## 2024-01-26 ENCOUNTER — Other Ambulatory Visit (HOSPITAL_COMMUNITY)
Admission: RE | Admit: 2024-01-26 | Discharge: 2024-01-26 | Disposition: A | Discharge status: Home / Self Care | Source: Ambulatory Visit | Attending: Obstetrics & Gynecology | Admitting: Obstetrics & Gynecology

## 2024-01-26 VITALS — BP 148/80 | HR 76 | Ht 67.0 in | Wt 195.8 lb

## 2024-01-26 DIAGNOSIS — R030 Elevated blood-pressure reading, without diagnosis of hypertension: Secondary | ICD-10-CM | POA: Diagnosis not present

## 2024-01-26 DIAGNOSIS — Z01419 Encounter for gynecological examination (general) (routine) without abnormal findings: Secondary | ICD-10-CM

## 2024-01-26 DIAGNOSIS — Z3041 Encounter for surveillance of contraceptive pills: Secondary | ICD-10-CM

## 2024-01-26 MED ORDER — SRONYX 0.1-20 MG-MCG PO TABS: 1.0000 | ORAL_TABLET | Freq: Every day | ORAL | 4 refills | Status: AC

## 2024-01-26 NOTE — Progress Notes (Signed)
 WELL-WOMAN EXAMINATION Patient name: Alicia Moon MRN [id number]  Date of birth: October 19, 1980 Chief Complaint:   Gynecologic Exam  History of Present Illness:   Alicia Moon is a 43 y.o. G15P0010  female being seen today for a routine well-woman exam.   Menses are super light, lasts only for a few days.  Denies HMB or intermenstrual bleeding.  Denies pelvic or abdominal pain.  No acute gyn concerns   No LMP recorded. (Menstrual status: Oral contraceptives). Denies issues with her menses The current method of family planning is OCP (estrogen/progesterone).    Last pap collected today.  Last mammogram: 03/2023. Last colonoscopy: NA     01/26/2024    8:37 AM 01/25/2023    8:28 AM 01/20/2022    8:53 AM 01/15/2021   10:50 AM  Depression screen PHQ 2/9  Decreased Interest 0 0 0 0  Down, Depressed, Hopeless 0 0 0 0  PHQ - 2 Score 0 0 0 0  Altered sleeping 0 0 0 0  Tired, decreased energy 0 0 0 0  Change in appetite 0 1 0 0  Feeling bad or failure about yourself  0 0 0 0  Trouble concentrating 0 0 0 0  Moving slowly or fidgety/restless 0 0 0 0  Suicidal thoughts 0 0 0 0  PHQ-9 Score 0 1 0 0      Review of Systems:   Pertinent items are noted in HPI Denies any headaches, blurred vision, fatigue, shortness of breath, chest pain, abdominal pain, bowel movements, urination, or intercourse unless otherwise stated above.  Pertinent History Reviewed:  Reviewed past medical,surgical, social and family history.  Reviewed problem list, medications and allergies. Physical Assessment:   Vitals:   01/26/24 0835 01/26/24 0945  BP: (!) 141/74 (!) 148/80  Pulse: 76   Weight: 195 lb 12.8 oz (88.8 kg)   Height: 5' 7 (1.702 m)   Body mass index is 30.67 kg/m.        Physical Examination:   General appearance - well appearing, and in no distress  Mental status - alert, oriented to person, place, and time  Psych:  She has a normal mood and affect  Skin - warm and dry, normal  color, no suspicious lesions noted  Chest - effort normal, all lung fields clear to auscultation bilaterally  Heart - normal rate and regular rhythm  Neck:  midline trachea, no thyromegaly or nodules  Breasts - breasts appear normal, no suspicious masses, no skin or nipple changes or  axillary nodes  Abdomen - soft, nontender, nondistended, no masses or organomegaly  Pelvic - VULVA: normal appearing vulva with no masses, tenderness or lesions  VAGINA: normal appearing vagina with normal color and discharge, no lesions  CERVIX: normal appearing cervix without discharge or lesions, no CMT  Thin prep pap is done with HR HPV cotesting  UTERUS: uterus is felt to be normal size, shape, consistency and nontender   ADNEXA: No adnexal masses or tenderness noted.  Extremities:  No swelling or varicosities noted  Chaperone: Lorean Rodes     Assessment & Plan:  1) Well-Woman Exam -pap collected, reviewed screening guidelines  2) Elevated BP No prior h/o HTN Pt to continue monitoring at home, at least once a month- if above >140/90 consistently pt to call  []  would plan to change to different pill OCP > POP  3) Contraceptive management -continue with current pill -discussed concerns regarding increased BP and current pill.   No orders of the defined  types were placed in this encounter.   Meds:  Meds ordered this encounter  Medications   SRONYX  0.1-20 MG-MCG tablet    Sig: Take 1 tablet by mouth daily.    Dispense:  90 tablet    Refill:  4    Follow-up: Return in about 1 year (around 01/25/2025) for Annual.   Jac Romulus, DO Attending Obstetrician & Gynecologist, [facility], [facility]

## 2024-01-30 ENCOUNTER — Ambulatory Visit: Payer: Self-pay | Admitting: Obstetrics & Gynecology

## 2024-01-30 LAB — CYTOLOGY - PAP
Adequacy: ABSENT
Comment: NEGATIVE
Diagnosis: NEGATIVE
High risk HPV: NEGATIVE

## 2024-02-28 DIAGNOSIS — E7800 Pure hypercholesterolemia, unspecified: Secondary | ICD-10-CM | POA: Diagnosis not present

## 2024-02-29 ENCOUNTER — Encounter: Payer: Self-pay | Admitting: Obstetrics & Gynecology

## 2024-03-06 DIAGNOSIS — Z0001 Encounter for general adult medical examination with abnormal findings: Secondary | ICD-10-CM | POA: Diagnosis not present

## 2024-03-06 DIAGNOSIS — Z6829 Body mass index (BMI) 29.0-29.9, adult: Secondary | ICD-10-CM | POA: Diagnosis not present

## 2024-03-06 DIAGNOSIS — E7800 Pure hypercholesterolemia, unspecified: Secondary | ICD-10-CM | POA: Diagnosis not present

## 2024-03-06 DIAGNOSIS — R7303 Prediabetes: Secondary | ICD-10-CM | POA: Diagnosis not present

## 2024-03-06 DIAGNOSIS — K5190 Ulcerative colitis, unspecified, without complications: Secondary | ICD-10-CM | POA: Diagnosis not present

## 2024-04-18 DIAGNOSIS — L905 Scar conditions and fibrosis of skin: Secondary | ICD-10-CM | POA: Diagnosis not present

## 2024-04-18 DIAGNOSIS — Z08 Encounter for follow-up examination after completed treatment for malignant neoplasm: Secondary | ICD-10-CM | POA: Diagnosis not present

## 2024-04-18 DIAGNOSIS — Z85828 Personal history of other malignant neoplasm of skin: Secondary | ICD-10-CM | POA: Diagnosis not present

## 2024-04-18 DIAGNOSIS — Z1283 Encounter for screening for malignant neoplasm of skin: Secondary | ICD-10-CM | POA: Diagnosis not present

## 2024-04-18 DIAGNOSIS — D225 Melanocytic nevi of trunk: Secondary | ICD-10-CM | POA: Diagnosis not present

## 2024-05-02 ENCOUNTER — Other Ambulatory Visit (HOSPITAL_COMMUNITY): Payer: Self-pay | Admitting: Obstetrics & Gynecology

## 2024-05-02 DIAGNOSIS — Z1231 Encounter for screening mammogram for malignant neoplasm of breast: Secondary | ICD-10-CM

## 2024-05-10 ENCOUNTER — Ambulatory Visit (HOSPITAL_COMMUNITY)
Admission: RE | Admit: 2024-05-10 | Discharge: 2024-05-10 | Disposition: A | Discharge status: Home / Self Care | Source: Ambulatory Visit | Attending: Obstetrics & Gynecology | Admitting: Obstetrics & Gynecology

## 2024-05-10 DIAGNOSIS — Z1231 Encounter for screening mammogram for malignant neoplasm of breast: Secondary | ICD-10-CM | POA: Diagnosis not present

## 2024-05-16 ENCOUNTER — Ambulatory Visit: Payer: Self-pay | Admitting: Obstetrics & Gynecology
